# Patient Record
Sex: Female | Born: 1965 | Race: White | Hispanic: No | Marital: Married | Smoking: Current every day smoker
Health system: Southern US, Community
[De-identification: ages and names within clinical notes are randomized; demographics above are authoritative.]

## PROBLEM LIST (undated history)

## (undated) DIAGNOSIS — R011 Cardiac murmur, unspecified: Secondary | ICD-10-CM

## (undated) DIAGNOSIS — R634 Abnormal weight loss: Secondary | ICD-10-CM

## (undated) HISTORY — PX: TUBAL LIGATION: SHX77

## (undated) HISTORY — DX: Abnormal weight loss: R63.4

---

## 2016-05-30 ENCOUNTER — Emergency Department: Payer: Self-pay

## 2016-05-30 ENCOUNTER — Encounter: Payer: Self-pay | Admitting: Urgent Care

## 2016-05-30 ENCOUNTER — Emergency Department
Admission: EM | Admit: 2016-05-30 | Discharge: 2016-05-31 | Disposition: A | Discharge status: Home / Self Care | Payer: Self-pay | Attending: Emergency Medicine | Admitting: Emergency Medicine

## 2016-05-30 DIAGNOSIS — R1012 Left upper quadrant pain: Secondary | ICD-10-CM

## 2016-05-30 DIAGNOSIS — K529 Noninfective gastroenteritis and colitis, unspecified: Secondary | ICD-10-CM | POA: Insufficient documentation

## 2016-05-30 DIAGNOSIS — F17200 Nicotine dependence, unspecified, uncomplicated: Secondary | ICD-10-CM | POA: Insufficient documentation

## 2016-05-30 HISTORY — DX: Cardiac murmur, unspecified: R01.1

## 2016-05-30 LAB — COMPREHENSIVE METABOLIC PANEL
ALBUMIN: 3.8 g/dL (ref 3.5–5.0)
ALT: 13 U/L — ABNORMAL LOW (ref 14–54)
ANION GAP: 7 (ref 5–15)
AST: 16 U/L (ref 15–41)
Alkaline Phosphatase: 53 U/L (ref 38–126)
BILIRUBIN TOTAL: 0.4 mg/dL (ref 0.3–1.2)
BUN: 16 mg/dL (ref 6–20)
CO2: 28 mmol/L (ref 22–32)
Calcium: 9 mg/dL (ref 8.9–10.3)
Chloride: 103 mmol/L (ref 101–111)
Creatinine, Ser: 1.01 mg/dL — ABNORMAL HIGH (ref 0.44–1.00)
GFR calc Af Amer: >60 mL/min (ref >60)
Glucose, Bld: 102 mg/dL — ABNORMAL HIGH (ref 65–99)
POTASSIUM: 3.5 mmol/L (ref 3.5–5.1)
SODIUM: 138 mmol/L (ref 135–145)
TOTAL PROTEIN: 7.6 g/dL (ref 6.5–8.1)

## 2016-05-30 LAB — CBC
HCT: 41.3 % (ref 35.0–47.0)
HEMOGLOBIN: 14.2 g/dL (ref 12.0–16.0)
MCH: 31.2 pg (ref 26.0–34.0)
MCHC: 34.5 g/dL (ref 32.0–36.0)
MCV: 90.5 fL (ref 80.0–100.0)
PLATELETS: 287 10*3/uL (ref 150–440)
RBC: 4.56 MIL/uL (ref 3.80–5.20)
RDW: 12.4 % (ref 11.5–14.5)
WBC: 10.8 10*3/uL (ref 3.6–11.0)

## 2016-05-30 LAB — URINALYSIS COMPLETE WITH MICROSCOPIC (ARMC ONLY)
Bilirubin Urine: NEGATIVE
Glucose, UA: NEGATIVE mg/dL
KETONES UR: NEGATIVE mg/dL
LEUKOCYTES UA: NEGATIVE
Nitrite: NEGATIVE
PH: 6 (ref 5.0–8.0)
PROTEIN: NEGATIVE mg/dL
SPECIFIC GRAVITY, URINE: 1.024 (ref 1.005–1.030)

## 2016-05-30 LAB — POCT PREGNANCY, URINE: Preg Test, Ur: NEGATIVE

## 2016-05-30 MED ORDER — DICYCLOMINE HCL 10 MG PO CAPS: 10.0000 mg | ORAL_CAPSULE | Freq: Three times a day (TID) | ORAL | Status: DC | PRN

## 2016-05-30 MED ORDER — POLYETHYLENE GLYCOL 3350 17 GM/SCOOP PO POWD: 17.0000 g | Freq: Every day | ORAL | Status: DC

## 2016-05-30 MED ORDER — IOPAMIDOL (ISOVUE-300) INJECTION 61%
100.0000 mL | Freq: Once | INTRAVENOUS | Status: AC | PRN
  Administered 2016-05-30: 100 mL via INTRAVENOUS

## 2016-05-30 MED ORDER — DIATRIZOATE MEGLUMINE & SODIUM 66-10 % PO SOLN
15.0000 mL | Freq: Once | ORAL | Status: AC
  Administered 2016-05-30: 15 mL via ORAL

## 2016-05-30 NOTE — ED Notes (Signed)
Patient presents with worsening abdominal pain and diffuse abdominal distention x 2-3 months. Patient has not had a normal BM in over 2 weeks. Patient has been seen by [facility] and advised to try Mag Citrate dose; effective, however pain and distention persistent. Denies nausea, vomiting, and urinary symptoms.

## 2016-05-30 NOTE — ED Notes (Signed)
Pt c/o abnormal bowel movements for months causing abd to be distended - she reports that she went to [facility] walk in and they told her to take mag citrate and it worked but she continues to have "slimy" stools - Pt reports abd discomfort but no difficulty with urination or urinary frequency

## 2016-05-30 NOTE — ED Provider Notes (Addendum)
[facility] Emergency Department Provider Note  Time seen: 11:07 PM  I have reviewed the triage vital signs and the nursing notes.   HISTORY  Chief Complaint Abdominal Pain    HPI Victoria Chan is a 50 y.o. female with no past medical history who presents the emergency department with 2 months of abdominal distention and discomfort. According to the patient for the past 2 months she has felt that her abdomen has been distended air and she states she is constipated but has a bowel movement every day. States the bowel movements are "slimy." Patient states moderate left-sided abdominal pain. She took magnesium citrate with some relief but states after several days the pain returned so she came to the emergency department for evaluation. The patient does not have a primary care doctor. Denies any fever, nausea, vomiting, dysuria. Denies weight loss.Currently states moderate dull left-sided abdominal pain.     Past Medical History  Diagnosis Date  . Murmur     There are no active problems to display for this patient.   Past Surgical History  Procedure Laterality Date  . Tubal ligation      No current outpatient prescriptions on file.  Allergies Review of patient's allergies indicates no known allergies.  No family history on file.  Social History Social History  Substance Use Topics  . Smoking status: Current Every Day Smoker  . Smokeless tobacco: None  . Alcohol Use: Yes    Review of Systems Constitutional: Negative for fever Cardiovascular: Negative for chest pain. Respiratory: Negative for shortness of breath. Gastrointestinal: Moderate left abdominal pain. Negative for nausea, vomiting. Genitourinary: Negative for dysuria. Musculoskeletal: Negative for back pain. Neurological: Negative for headache 10-point ROS otherwise negative.  ____________________________________________   PHYSICAL EXAM:  VITAL SIGNS: ED Triage Vitals  Enc  Vitals Group     BP 05/30/16 2216 146/89 mmHg     Pulse Rate 05/30/16 2216 104     Resp 05/30/16 2216 20     Temp 05/30/16 2216 98.9 F (37.2 C)     Temp Source 05/30/16 2216 Oral     SpO2 05/30/16 2216 100 %     Weight 05/30/16 2216 220 lb (99.791 kg)     Height 05/30/16 2216 5' 3"  (1.6 m)     Head Cir --      Peak Flow --      Pain Score 05/30/16 2216 2     Pain Loc --      Pain Edu? --      Excl. in [place]? --     Constitutional: Alert and oriented. Well appearing and in no distress. Eyes: Normal exam ENT   Head: Normocephalic and atraumatic.   Mouth/Throat: Mucous membranes are moist. Cardiovascular: Normal rate, regular rhythm. No murmur Respiratory: Normal respiratory effort without tachypnea nor retractions. Breath sounds are clear  Gastrointestinal: Soft, moderate left upper quadrant tenderness to palpation. No rebound or guarding. Hyperactive bowel sounds. Dull percussion. Musculoskeletal: Nontender with normal range of motion in all extremities Neurologic:  Normal speech and language. No gross focal neurologic deficits Skin:  Skin is warm, dry and intact.  Psychiatric: Mood and affect are normal  ____________________________________________    RADIOLOGY  CT consistent with colitis.  ____________________________________________    INITIAL IMPRESSION / ASSESSMENT AND PLAN / ED COURSE  Pertinent labs & imaging results that were available during my care of the patient were reviewed by me and considered in my medical decision making (see chart for details).  Patient presents the emergency department with abdominal distention and vague left-sided discomfort for the past 2 months. States she has been having abnormal but daily bowel movements. States mild left-sided abdominal pain. Patient is labs are within normal limits. Given her left-sided abdominal tenderness with complaints of abdominal distention we'll obtain a CT abdomen/pelvis to further evaluate. I  discussed with the patient the CT the abdomen is normal that she will need to follow up with GI medicine for further testing. Patient is agreeable to this plan.  CT consistent with colitis. Infectious versus inflammatory. Overall the patient appears very well. We'll cover with antibiotics now the patient follow-up with GI medicine. The patient is agreeable to this plan.  ____________________________________________   FINAL CLINICAL IMPRESSION(S) / ED DIAGNOSES  Abdominal pain Colitis  Harvest Dark, MD 05/30/16 2312  Harvest Dark, MD 05/30/16 2359

## 2016-05-30 NOTE — ED Notes (Signed)
CT called and notified that pt had finished drinking contrast

## 2016-05-31 MED ORDER — METRONIDAZOLE 500 MG PO TABS
500.0000 mg | ORAL_TABLET | Freq: Once | ORAL | Status: AC
  Administered 2016-05-31: 500 mg via ORAL
  Filled 2016-05-31: qty 1

## 2016-05-31 MED ORDER — METRONIDAZOLE 500 MG PO TABS: 500.0000 mg | ORAL_TABLET | Freq: Three times a day (TID) | ORAL | Status: AC

## 2016-05-31 MED ORDER — TRAMADOL HCL 50 MG PO TABS: 50.0000 mg | ORAL_TABLET | Freq: Four times a day (QID) | ORAL | Status: AC | PRN

## 2016-05-31 MED ORDER — CIPROFLOXACIN HCL 500 MG PO TABS: 500.0000 mg | ORAL_TABLET | Freq: Two times a day (BID) | ORAL | Status: AC

## 2016-05-31 MED ORDER — CIPROFLOXACIN HCL 500 MG PO TABS
500.0000 mg | ORAL_TABLET | Freq: Once | ORAL | Status: AC
  Administered 2016-05-31: 500 mg via ORAL
  Filled 2016-05-31: qty 1

## 2016-05-31 NOTE — Discharge Instructions (Signed)
Abdominal Pain, Adult Many things can cause abdominal pain. Usually, abdominal pain is not caused by a disease and will improve without treatment. It can often be observed and treated at home. Your health care provider will do a physical exam and possibly order blood tests and X-rays to help determine the seriousness of your pain. However, in many cases, more time must pass before a clear cause of the pain can be found. Before that point, your health care provider may not know if you need more testing or further treatment. HOME CARE INSTRUCTIONS Monitor your abdominal pain for any changes. The following actions may help to alleviate any discomfort you are experiencing:  Only take over-the-counter or prescription medicines as directed by your health care provider.  Do not take laxatives unless directed to do so by your health care provider.  Try a clear liquid diet (broth, tea, or water) as directed by your health care provider. Slowly move to a bland diet as tolerated. SEEK MEDICAL CARE IF:  You have unexplained abdominal pain.  You have abdominal pain associated with nausea or diarrhea.  You have pain when you urinate or have a bowel movement.  You experience abdominal pain that wakes you in the night.  You have abdominal pain that is worsened or improved by eating food.  You have abdominal pain that is worsened with eating fatty foods.  You have a fever. SEEK IMMEDIATE MEDICAL CARE IF:  Your pain does not go away within 2 hours.  You keep throwing up (vomiting).  Your pain is felt only in portions of the abdomen, such as the right side or the left lower portion of the abdomen.  You pass bloody or black tarry stools. MAKE SURE YOU:  Understand these instructions.  Will watch your condition.  Will get help right away if you are not doing well or get worse.   This information is not intended to replace advice given to you by your health care provider. Make sure you discuss  any questions you have with your health care provider.   Document Released: 08/20/2005 Document Revised: 08/01/2015 Document Reviewed: 07/20/2013 Elsevier Interactive Patient Education 2016 Elsevier Inc[employer]  Colitis Colitis is inflammation of the colon. Colitis may last a short time (acute) or it may last a long time (chronic). CAUSES This condition may be caused by:  Viruses.  Bacteria.  Reactions to medicine.  Certain autoimmune diseases, such as Crohn disease or ulcerative colitis. SYMPTOMS Symptoms of this condition include:  Diarrhea.  Passing bloody or tarry stool.  Pain.  Fever.  Vomiting.  Tiredness (fatigue).  Weight loss.  Bloating.  Sudden increase in abdominal pain.  Having fewer bowel movements than usual. DIAGNOSIS This condition is diagnosed with a stool test or a blood test. You may also have other tests, including X-rays, a CT scan, or a colonoscopy. TREATMENT Treatment may include:  Resting the bowel. This involves not eating or drinking for a period of time.  Fluids that are given through an IV tube.  Medicine for pain and diarrhea.  Antibiotic medicines.  Cortisone medicines.  Surgery. HOME CARE INSTRUCTIONS Eating and Drinking  Follow instructions from your health care provider about eating or drinking restrictions.  Drink enough fluid to keep your urine clear or pale yellow.  Work with a dietitian to determine which foods cause your condition to flare up.  Avoid foods that cause flare-ups.  Eat a well-balanced diet. Medicines  Take over-the-counter and prescription medicines only as told by your health  care provider.  If you were prescribed an antibiotic medicine, take it as told by your health care provider. Do not stop taking the antibiotic even if you start to feel better. General Instructions  Keep all follow-up visits as told by your health care provider. This is important. SEEK MEDICAL CARE IF:  Your symptoms do  not go away.  You develop new symptoms. SEEK IMMEDIATE MEDICAL CARE IF:  You have a fever that does not go away with treatment.  You develop chills.  You have extreme weakness, fainting, or dehydration.  You have repeated vomiting.  You develop severe pain in your abdomen.  You pass bloody or tarry stool.   This information is not intended to replace advice given to you by your health care provider. Make sure you discuss any questions you have with your health care provider.   Document Released: 12/18/2004 Document Revised: 08/01/2015 Document Reviewed: 03/05/2015 Elsevier Interactive Patient Education [employer].

## 2016-06-09 ENCOUNTER — Encounter: Payer: Self-pay | Admitting: Gastroenterology

## 2016-06-09 ENCOUNTER — Other Ambulatory Visit: Payer: Self-pay

## 2016-06-09 ENCOUNTER — Ambulatory Visit (INDEPENDENT_AMBULATORY_CARE_PROVIDER_SITE_OTHER): Payer: Self-pay | Admitting: Gastroenterology

## 2016-06-09 VITALS — BP 153/84 | HR 87 | Temp 99.0°F | Ht 63.0 in | Wt 224.0 lb

## 2016-06-09 DIAGNOSIS — R935 Abnormal findings on diagnostic imaging of other abdominal regions, including retroperitoneum: Secondary | ICD-10-CM

## 2016-06-09 NOTE — Progress Notes (Signed)
Gastroenterology Consultation  Referring Provider:     No ref. provider found Primary Care Physician:  No PCP Per Patient Primary Gastroenterologist:  Dr. Allen Norris     Reason for Consultation:     Abdominal pain        HPI:   Victoria Chan is a 50 y.o. y/o female referred for consultation & management of Abdominal pain by Dr. Rayne Du PCP Per Patient.  This patient comes to me after being seen in the emergency  department for 2 months of abdominal pain with distention.  The patient reports that she has had GI problems most of her life.  The patient also reports that she had slimy-appearing stools with left sided abdominal pain.  The patient had a CT scan in the ER. The report stated that she had colitis from the splenic flexure through the sigmoid colon.  The patient denies any family history of colon cancer or colon polyps.   She also denies ever having a colonoscopy in the past.  There is no report of any unexplained weight loss.  Past Medical History  Diagnosis Date  . Murmur     Past Surgical History  Procedure Laterality Date  . Tubal ligation      Prior to Admission medications   Medication Sig Start Date End Date Taking? Authorizing Provider  ciprofloxacin (CIPRO) 500 MG tablet Take 1 tablet (500 mg total) by mouth 2 (two) times daily. 05/31/16 06/10/16 Yes Harvest Dark, MD  metroNIDAZOLE (FLAGYL) 500 MG tablet Take 500 mg by mouth 3 (three) times daily.   Yes Historical Provider, MD  polyethylene glycol (MIRALAX / GLYCOLAX) packet Take by mouth. Reported on 06/09/2016    Historical Provider, MD  traMADol (ULTRAM) 50 MG tablet Take 1 tablet (50 mg total) by mouth every 6 (six) hours as needed. Patient not taking: Reported on 06/09/2016 05/31/16 05/31/17  Harvest Dark, MD    Family History  Problem Relation Age of Onset  . Heart disease Father      Social History  Substance Use Topics  . Smoking status: Current Every Day Smoker  . Smokeless tobacco: Never Used  . Alcohol  Use: Yes    Allergies as of 06/09/2016  . (No Known Allergies)    Review of Systems:    All systems reviewed and negative except where noted in HPI.   Physical Exam:  BP 153/84 mmHg  Pulse 87  Temp(Src) 99 F (37.2 C) (Oral)  Ht 5' 3"  (1.6 m)  Wt 224 lb (101.606 kg)  BMI 39.69 kg/m2  LMP 04/24/2016 Patient's last menstrual period was 04/24/2016. Psych:  Alert and cooperative. Normal mood and affect. General:   Alert,  Well-developed, well-nourished, pleasant and cooperative in NAD Head:  Normocephalic and atraumatic. Eyes:  Sclera clear, no icterus.   Conjunctiva pink. Ears:  Normal auditory acuity. Nose:  No deformity, discharge, or lesions. Mouth:  No deformity or lesions,oropharynx pink & moist. Neck:  Supple; no masses or thyromegaly. Lungs:  Respirations even and unlabored.  Clear throughout to auscultation.   No wheezes, crackles, or rhonchi. No acute distress. Heart:  Regular rate and rhythm; no murmurs, clicks, rubs, or gallops. Abdomen:  Normal bowel sounds.  No bruits.  Soft, non-tender and non-distended without masses, hepatosplenomegaly or hernias noted.  No guarding or rebound tenderness.  Negative Carnett sign.   Rectal:  Deferred.  Msk:  Symmetrical without gross deformities.  Good, equal movement & strength bilaterally. Pulses:  Normal pulses noted. Extremities:  No  clubbing or edema.  No cyanosis. Neurologic:  Alert and oriented x3;  grossly normal neurologically. Skin:  Intact without significant lesions or rashes.  No jaundice. Lymph Nodes:  No significant cervical adenopathy. Psych:  Alert and cooperative. Normal mood and affect.  Imaging Studies: Ct Abdomen Pelvis W Contrast  05/30/2016  CLINICAL DATA:  Left upper quadrant pain. Abdominal distension. Abnormal bowel movements for months. EXAM: CT ABDOMEN AND PELVIS WITH CONTRAST TECHNIQUE: Multidetector CT imaging of the abdomen and pelvis was performed using the standard protocol following bolus  administration of intravenous contrast. CONTRAST:  150m ISOVUE-300 IOPAMIDOL (ISOVUE-300) INJECTION 61% COMPARISON:  None. FINDINGS: Lower chest:  The included lung bases are clear. Liver: No focal lesion. Hepatobiliary: Gallbladder partially distended, no calcified stone. No biliary dilatation. Pancreas: No ductal dilatation or inflammation. Spleen: Normal. Adrenal glands: No nodule. Kidneys: Symmetric renal enhancement.  No hydronephrosis. Stomach/Bowel: Stomach is distended with ingested contrast and contents. There are no dilated or thickened small bowel loops. There is colonic wall thickening and pericolonic edema from the splenic flexure through the mid sigmoid colon. Small adjacent mesenteric nodes. Moderate stool in the proximal colon. Mild flaring of the mid appendix assuring 10 mm CT however no periappendiceal inflammation, and this is likely spurious. Vascular/Lymphatic: No retroperitoneal adenopathy. Abdominal aorta is normal in caliber. Reproductive: Uterus is retroverted. Ovaries symmetric in size. No adnexal mass. Bladder: Minimally distended. Other: No free air, free fluid, or intra-abdominal fluid collection. Musculoskeletal: There are no acute or suspicious osseous abnormalities. Degenerative change in the spine and both sacroiliac joints. IMPRESSION: Colitis from the splenic flexure through the mid sigmoid colon, may be infectious or inflammatory. Electronically Signed   By: MJeb LeveringM.D.   On: 05/30/2016 23:45    Assessment and Plan:   Victoria Chan is a 50y.o. y/o female with colitis seen on a CT scan.  The patient was started on antibiotics in the ER and reports that she is feeling much better.The patient will be set up for a colonoscopy. I have discussed risks & benefits which include, but are not limited to, bleeding, infection, perforation & drug reaction.  The patient agrees with this plan & written consent will be obtained.      Note: This dictation was prepared with  Dragon dictation along with smaller phrase technology. Any transcriptional errors that result from this process are unintentional.

## 2016-12-31 ENCOUNTER — Telehealth: Payer: Self-pay | Admitting: Nurse Practitioner

## 2016-12-31 DIAGNOSIS — R059 Cough, unspecified: Secondary | ICD-10-CM

## 2016-12-31 DIAGNOSIS — R05 Cough: Secondary | ICD-10-CM

## 2016-12-31 NOTE — Progress Notes (Signed)
We are sorry that you are not feeling well.  Here is how we plan to help!  Based on what you have shared with me it looks like you have upper respiratory tract inflammation that has resulted in a significant cough.  Inflammation and infection in the upper respiratory tract is commonly called bronchitis and has four common causes:  Allergies, Viral Infections, Acid Reflux and Bacterial Infections.  Allergies, viruses and acid reflux are treated by controlling symptoms or eliminating the cause. An example might be a cough caused by taking certain blood pressure medications. You stop the cough by changing the medication. Another example might be a cough caused by acid reflux. Controlling the reflux helps control the cough.  Based on your presentation I believe you most likely have A cough due to a virus.  This is called viral bronchitis and is best treated by rest, plenty of fluids and control of the cough.  You will need to contact your OB Doctor to see what you can take OTC.       HOME CARE . Only take medications as instructed by your medical team. . Complete the entire course of an antibiotic. . Drink plenty of fluids and get plenty of rest. . Avoid close contacts especially the very young and the elderly . Cover your mouth if you cough or cough into your sleeve. . Always remember to wash your hands . A steam or ultrasonic humidifier can help congestion.   GET HELP RIGHT AWAY IF: . You develop worsening fever. . You become short of breath . You cough up blood. . Your symptoms persist after you have completed your treatment plan MAKE SURE YOU   Understand these instructions.  Will watch your condition.  Will get help right away if you are not doing well or get worse.  Your e-visit answers were reviewed by a board certified advanced clinical practitioner to complete your personal care plan.  Depending on the condition, your plan could have included both over the counter or prescription  medications. If there is a problem please reply  once you have received a response from your provider. Your safety is important to [place].  If you have drug allergies check your prescription carefully.    You can use MyChart to ask questions about today's visit, request a non-urgent call back, or ask for a work or school excuse for 24 hours related to this e-Visit. If it has been greater than 24 hours you will need to follow up with your provider, or enter a new e-Visit to address those concerns. You will get an e-mail in the next two days asking about your experience.  I hope that your e-visit has been valuable and will speed your recovery. Thank you for using e-visits.

## 2017-06-17 IMAGING — CT CT ABD-PELV W/ CM
2 of 5 series · 16 of 46 positions shown, 18 images · IV contrast (iopamidol)
Comparison: None.

CLINICAL DATA: Left upper quadrant pain. Abdominal distension.
Abnormal bowel movements for months.

EXAM:
CT ABDOMEN AND PELVIS WITH CONTRAST
TECHNIQUE: Multidetector CT imaging of the abdomen and pelvis was performed
using the standard protocol following bolus administration of
intravenous contrast.
CONTRAST:  100mL SQ3UE1-C33 IOPAMIDOL (SQ3UE1-C33) INJECTION 61%

[Series 2: routine abd pel with · axial · 0.90mm/px · z∈[-512,-92]mm · 13 of 94 slices shown, 15 images]
[im 5/94  soft-tissue]
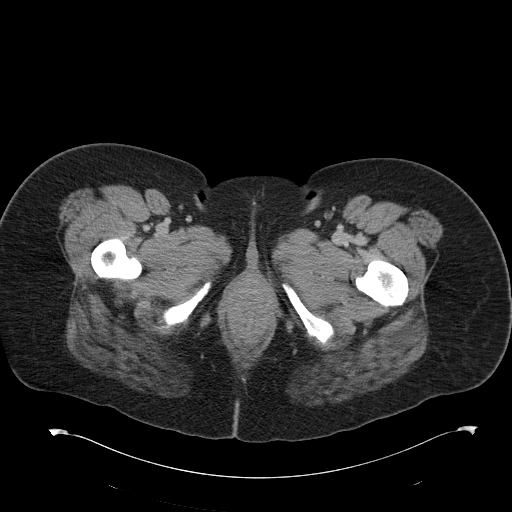
[im 5/94  bone]
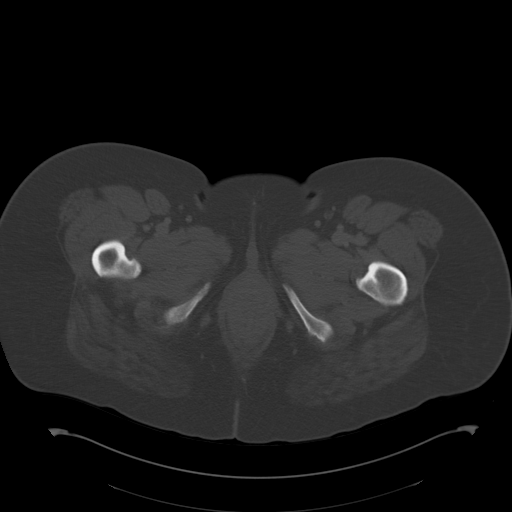
[im 14/94  soft-tissue]
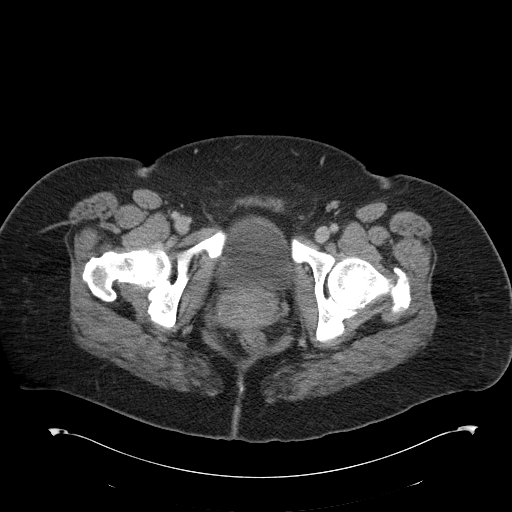
[im 19/94  soft-tissue]
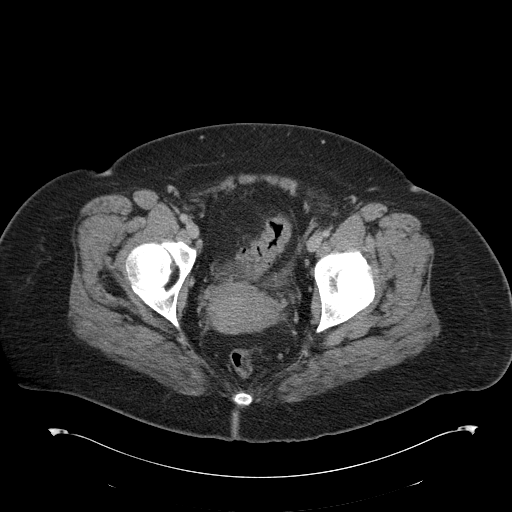
[im 28/94  soft-tissue]
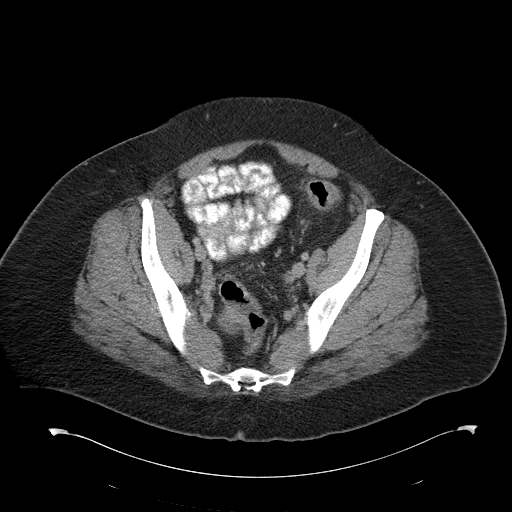
[im 33/94  soft-tissue]
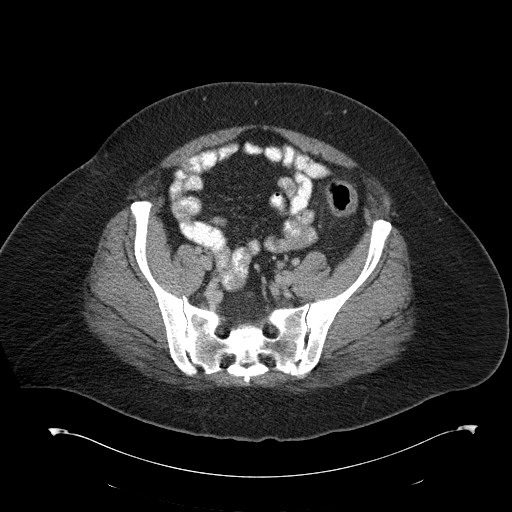
[im 42/94  soft-tissue]
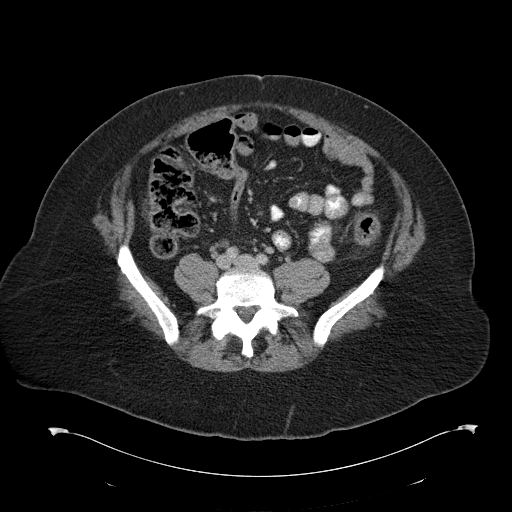
[im 47/94  soft-tissue]
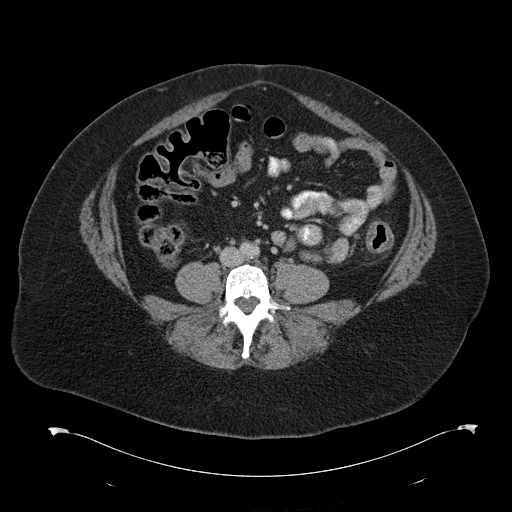
[im 52/94  soft-tissue]
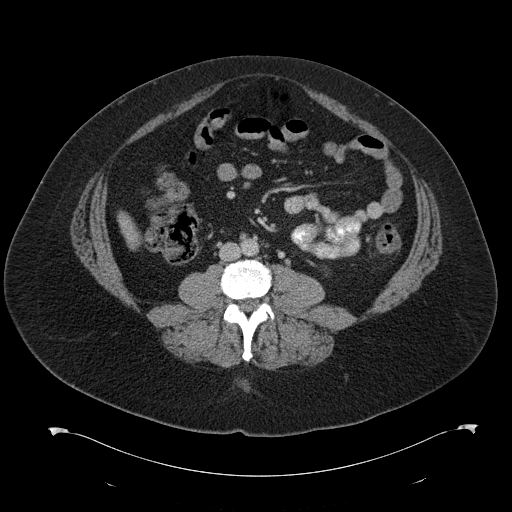
[im 61/94  soft-tissue]
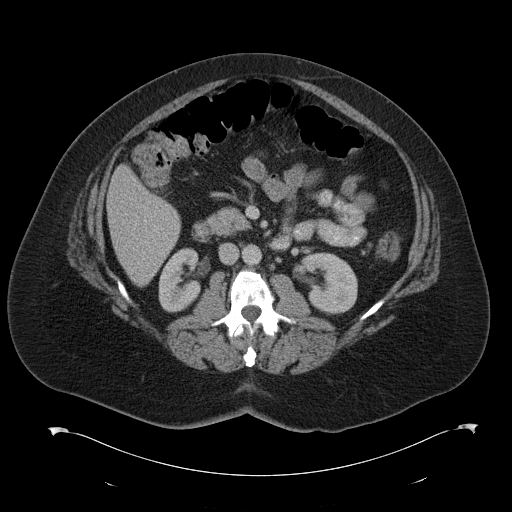
[im 61/94  bone]
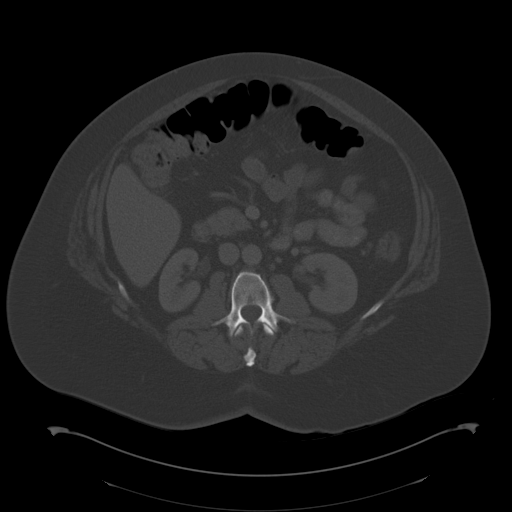
[im 66/94  soft-tissue]
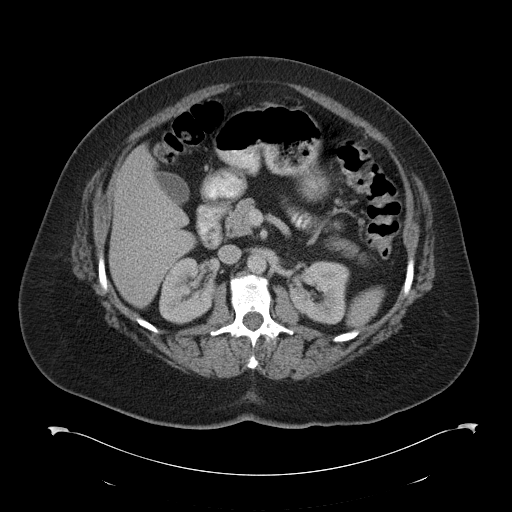
[im 75/94  soft-tissue]
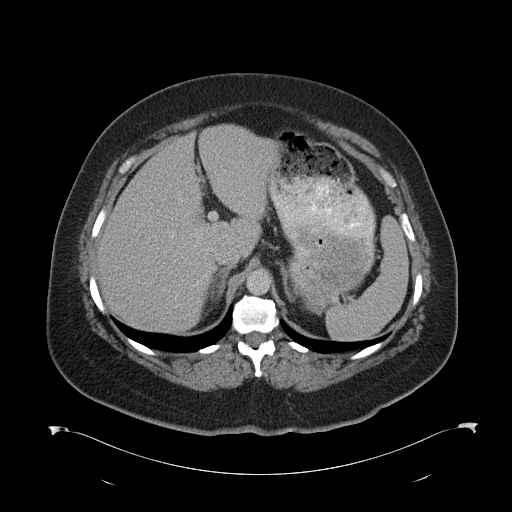
[im 80/94  soft-tissue]
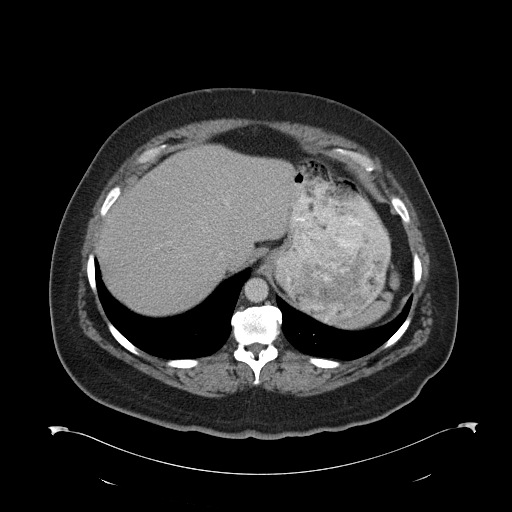
[im 89/94  soft-tissue]
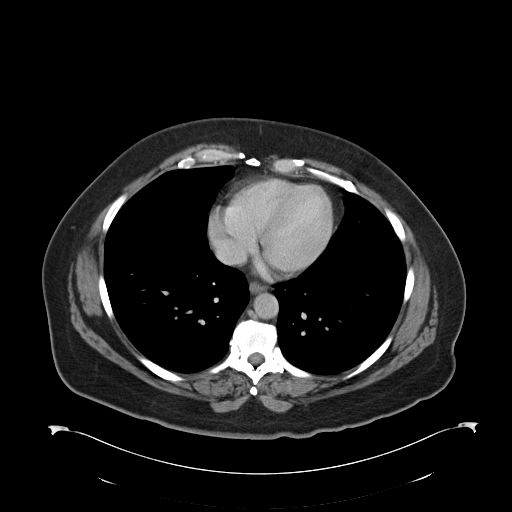

[Series 5: cor routine abd pel with · coronal · 0.78mm/px · 3 of 169 slices shown]
[im 57/169  soft-tissue]
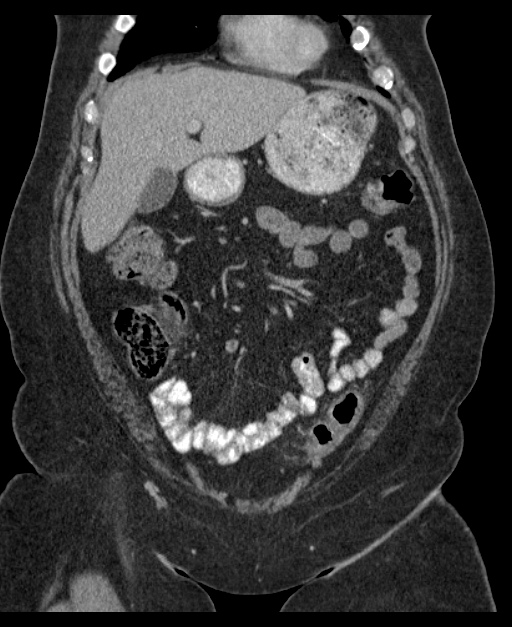
[im 75/169  soft-tissue]
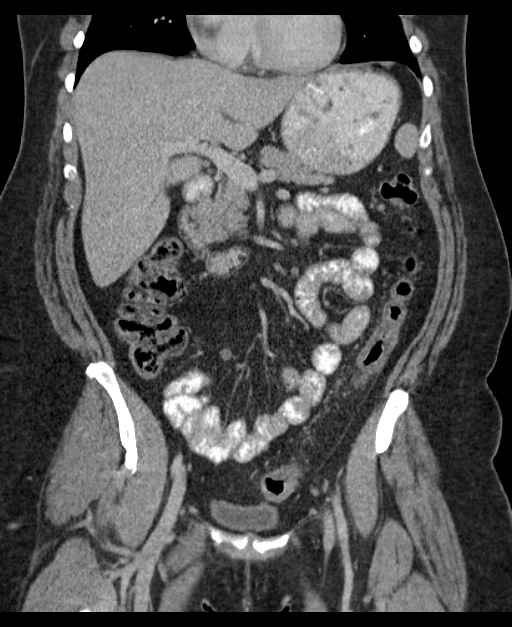
[im 94/169  soft-tissue]
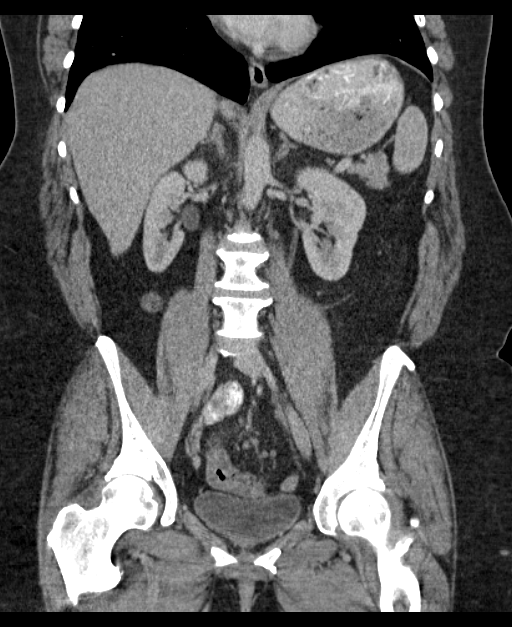

[16 of 46 positions shown; findings below may reference images not displayed]

FINDINGS: Lower chest:  The included lung bases are clear.

Liver: No focal lesion.

Hepatobiliary: Gallbladder partially distended, no calcified stone.
No biliary dilatation.

Pancreas: No ductal dilatation or inflammation.

Spleen: Normal.

Adrenal glands: No nodule.

Kidneys: Symmetric renal enhancement.  No hydronephrosis.

Stomach/Bowel: Stomach is distended with ingested contrast and
contents. There are no dilated or thickened small bowel loops. There
is colonic wall thickening and pericolonic edema from the splenic
flexure through the mid sigmoid colon. Small adjacent mesenteric
nodes. Moderate stool in the proximal colon. Mild flaring of the mid
appendix assuring 10 mm CT however no periappendiceal inflammation,
and this is likely spurious.

Vascular/Lymphatic: No retroperitoneal adenopathy. Abdominal aorta
is normal in caliber.

Reproductive: Uterus is retroverted. Ovaries symmetric in size. No
adnexal mass.

Bladder: Minimally distended.

Other: No free air, free fluid, or intra-abdominal fluid collection.

Musculoskeletal: There are no acute or suspicious osseous
abnormalities. Degenerative change in the spine and both sacroiliac
joints.
IMPRESSION: Colitis from the splenic flexure through the mid sigmoid colon, may
be infectious or inflammatory.

## 2019-09-15 ENCOUNTER — Other Ambulatory Visit: Payer: Self-pay

## 2019-09-15 ENCOUNTER — Encounter: Payer: Self-pay | Admitting: Gastroenterology

## 2019-09-15 ENCOUNTER — Ambulatory Visit: Payer: Self-pay | Admitting: Gastroenterology

## 2019-09-15 VITALS — BP 123/75 | HR 67 | Temp 98.1°F | Ht 63.0 in | Wt 205.2 lb

## 2019-09-15 DIAGNOSIS — R1012 Left upper quadrant pain: Secondary | ICD-10-CM

## 2019-09-15 DIAGNOSIS — R935 Abnormal findings on diagnostic imaging of other abdominal regions, including retroperitoneum: Secondary | ICD-10-CM

## 2019-09-15 NOTE — Progress Notes (Signed)
Victoria Chan, [place] [address]  Main: [phone]  Fax: [phone]   Gastroenterology Consultation  Referring Provider:     Charlette Caffey Primary Care Physician:  Patient, No Pcp Per Reason for Consultation:     Abdominal pain        HPI:    Chief Complaint  Patient presents with  . New Patient (Initial Visit)  . Abdominal Pain    Victoria Chan is a 53 y.o. y/o female referred for consultation & management  by Dr. Patient, No Pcp Per.  Patient reports 1 week history of abdominal pain, left upper quadrant, dull, nonradiating, 5/10.  Visited urgent care 2 days ago and was given antibiotics and with this pain has improved.  No blood in stool.  No altered bowel habits.  No nausea or vomiting.  Had a similar episode in 2017 at which time she had a CT scan that showed thickening at the splenic flexure to sigmoid colon.  She said she saw Dr. Allen Norris at the time a colonoscopy was recommended but due to insurance issues she did not get it done.  No prior colonoscopies.  Past Medical History:  Diagnosis Date  . Murmur     Past Surgical History:  Procedure Laterality Date  . TUBAL LIGATION      Prior to Admission medications   Medication Sig Start Date End Date Taking? Authorizing Provider  metroNIDAZOLE (FLAGYL) 500 MG tablet Take 500 mg by mouth 3 (three) times daily.   Yes [provider]  dicyclomine (BENTYL) 10 MG capsule Take 1 capsule (10 mg total) by mouth 3 (three) times daily as needed for spasms (abdominal discomfort). 05/30/16 05/31/16  Harvest Dark, MD    Family History  Problem Relation Age of Onset  . Heart disease Father      Social History   Tobacco Use  . Smoking status: Current Every Day Smoker  . Smokeless tobacco: Never Used  Substance Use Topics  . Alcohol use: Yes  . Drug use: No    Allergies as of 09/15/2019  . (No Known Allergies)    Review of Systems:    All systems reviewed  and negative except where noted in HPI.   Physical Exam:  BP 123/75 (BP Location: Left Arm, Patient Position: Sitting, Cuff Size: Normal)   Pulse 67   Temp 98.1 F (36.7 C) (Oral)   Ht 5' 3"  (1.6 m)   Wt 205 lb 4 oz (93.1 kg)   BMI 36.36 kg/m  No LMP recorded. Psych:  Alert and cooperative. Normal mood and affect. General:   Alert,  Well-developed, well-nourished, pleasant and cooperative in NAD Head:  Normocephalic and atraumatic. Eyes:  Sclera clear, no icterus.   Conjunctiva pink. Ears:  Normal auditory acuity. Nose:  No deformity, discharge, or lesions. Mouth:  No deformity or lesions,oropharynx pink & moist. Neck:  Supple; no masses or thyromegaly. Abdomen:  Normal bowel sounds.  No bruits.  Soft, non-tender and non-distended without masses, hepatosplenomegaly or hernias noted.  No guarding or rebound tenderness.    Msk:  Symmetrical without gross deformities. Good, equal movement & strength bilaterally. Pulses:  Normal pulses noted. Extremities:  No clubbing or edema.  No cyanosis. Neurologic:  Alert and oriented x3;  grossly normal neurologically. Skin:  Intact without significant lesions or rashes. No jaundice. Lymph Nodes:  No significant cervical adenopathy. Psych:  Alert and cooperative. Normal mood and affect.   Labs: CBC  Component Value Date/Time   WBC 10.8 05/30/2016 2231   RBC 4.56 05/30/2016 2231   HGB 14.2 05/30/2016 2231   HCT 41.3 05/30/2016 2231   PLT 287 05/30/2016 2231   MCV 90.5 05/30/2016 2231   MCH 31.2 05/30/2016 2231   MCHC 34.5 05/30/2016 2231   RDW 12.4 05/30/2016 2231   CMP     Component Value Date/Time   NA 138 05/30/2016 2231   K 3.5 05/30/2016 2231   CL 103 05/30/2016 2231   CO2 28 05/30/2016 2231   GLUCOSE 102 (H) 05/30/2016 2231   BUN 16 05/30/2016 2231   CREATININE 1.01 (H) 05/30/2016 2231   CALCIUM 9.0 05/30/2016 2231   PROT 7.6 05/30/2016 2231   ALBUMIN 3.8 05/30/2016 2231   AST 16 05/30/2016 2231   ALT 13 (L)  05/30/2016 2231   ALKPHOS 53 05/30/2016 2231   BILITOT 0.4 05/30/2016 2231   GFRNONAA >60 05/30/2016 2231   GFRAA >60 05/30/2016 2231    Imaging Studies: CT report 2017 reviewed  Assessment and Plan:   Victoria Chan is a 53 y.o. y/o female has been referred for abdominal pain  Patient has never had a colonoscopy New abdominal pain needs further evaluation with CT scan, especially since previous CT scan 3 years ago was abnormal  if active inflammation present, will wait to do colonoscopy for at least 6 to 8 weeks  We will also obtain labs today as patient has not had any lab work done in 3 years.  2017 labs reviewed and was otherwise reassuring  Patient urged to follow-up with her primary care provider as well and she verbalized understanding   Dr Victoria Antigua  Speech recognition software was used to dictate the above note.

## 2019-09-15 NOTE — Patient Instructions (Addendum)
Your CT abdominal and pelvis is scheduled on 09/23/2019 at 10:30 arrive at 10:15am. Nothing to eat or drank 4 hours before. Please pick up prep before the procedure. Please go to outpatient imaging [address], [place], [place] [address]

## 2019-09-16 LAB — CBC
Hematocrit: 41.2 % (ref 34.0–46.6)
Hemoglobin: 13.8 g/dL (ref 11.1–15.9)
MCH: 29.4 pg (ref 26.6–33.0)
MCHC: 33.5 g/dL (ref 31.5–35.7)
MCV: 88 fL (ref 79–97)
Platelets: 302 10*3/uL (ref 150–450)
RBC: 4.69 x10E6/uL (ref 3.77–5.28)
RDW: 12.8 % (ref 11.7–15.4)
WBC: 10.5 10*3/uL (ref 3.4–10.8)

## 2019-09-16 LAB — COMPREHENSIVE METABOLIC PANEL
ALT: 10 IU/L (ref 0–32)
AST: 10 IU/L (ref 0–40)
Albumin/Globulin Ratio: 1.7 (ref 1.2–2.2)
Albumin: 4.3 g/dL (ref 3.8–4.9)
Alkaline Phosphatase: 61 IU/L (ref 39–117)
BUN/Creatinine Ratio: 10 (ref 9–23)
BUN: 11 mg/dL (ref 6–24)
Bilirubin Total: <0.2 mg/dL (ref 0.0–1.2)
CO2: 25 mmol/L (ref 20–29)
Calcium: 9.7 mg/dL (ref 8.7–10.2)
Chloride: 102 mmol/L (ref 96–106)
Creatinine, Ser: 1.05 mg/dL — ABNORMAL HIGH (ref 0.57–1.00)
GFR calc Af Amer: 71 mL/min/{1.73_m2} (ref >59)
GFR calc non Af Amer: 62 mL/min/{1.73_m2} (ref >59)
Globulin, Total: 2.6 g/dL (ref 1.5–4.5)
Glucose: 105 mg/dL — ABNORMAL HIGH (ref 65–99)
Potassium: 5.1 mmol/L (ref 3.5–5.2)
Sodium: 140 mmol/L (ref 134–144)
Total Protein: 6.9 g/dL (ref 6.0–8.5)

## 2019-09-16 LAB — C-REACTIVE PROTEIN: CRP: 5 mg/L (ref 0–10)

## 2019-09-20 ENCOUNTER — Telehealth: Payer: Self-pay

## 2019-09-20 NOTE — Telephone Encounter (Signed)
Patient verbalized understanding. Patient states she does not have a PCP at this time. Patient was given a list of PCP offices at her office visit that was accepting new patients. Patient states she will make a appointment with them very soon. Called the Radiology department at [phone] and Darrick Meigs the radiologist states that we are fine to do the CT abdominal and pelvis with contrast with her Creatinine level.

## 2019-09-20 NOTE — Telephone Encounter (Signed)
-----   Message from Virgel Manifold, MD sent at 09/20/2019 10:46 AM EDT ----- Caryl Pina please let the patient know, her labs show slight elevation in creatnine. This is her kidney function and needs to be followed and adressed by her PCP. Her glucose was also slightly elevated. Please ask her who her PCP is and update it, or ask her to see a PCP soon in this regard. Please ask radiology if her CT with contrast is still ok with that creatnine. Please forward her PCP these results  She should hydrate well and drink 8-10 glasses of water a day, especially before her CT scan

## 2019-09-23 ENCOUNTER — Ambulatory Visit: Admission: RE | Admit: 2019-09-23 | Payer: Self-pay | Source: Ambulatory Visit

## 2020-02-14 ENCOUNTER — Other Ambulatory Visit: Payer: Self-pay

## 2020-02-14 ENCOUNTER — Ambulatory Visit: Payer: Self-pay | Admitting: Gastroenterology

## 2020-02-14 ENCOUNTER — Encounter: Payer: Self-pay | Admitting: Gastroenterology

## 2020-02-14 VITALS — BP 144/76 | HR 87 | Temp 98.1°F | Ht 63.0 in | Wt 200.0 lb

## 2020-02-14 DIAGNOSIS — R194 Change in bowel habit: Secondary | ICD-10-CM

## 2020-02-14 DIAGNOSIS — R1032 Left lower quadrant pain: Secondary | ICD-10-CM

## 2020-02-14 NOTE — Progress Notes (Signed)
Primary Care Physician: Ranae Plumber, [place]  Primary Gastroenterologist:  Dr. Lucilla Lame  Chief Complaint  Patient presents with  . Abdominal Pain    HPI: Victoria Chan is a 54 y.o. female here for follow-up of left-sided abdominal pain.  The patient had a CT scan in the past that showed colitis from the splenic flexure down to the sigmoid colon.  The patient was treated with antibiotics and followed up again with Dr. Bonna Gains back in October of last year and at that time the patient was recommended to undergo a colonoscopy.  The patient says she has abdominal pain in the left lower quadrant and left middle quadrant that usually last anywhere from 1 to 3 days.  She also reports that she has lost about 27 pounds in the last 7 months.  This has been unintentional weight loss.  The patient has been hesitant to do anything in the past because of lack of insurance.  There is also a report of a change in her consistency of stool and she states that it is much softer and flaky which is a change for her.  Past Medical History:  Diagnosis Date  . Murmur     No current outpatient medications on file.   No current facility-administered medications for this visit.    Allergies as of 02/14/2020  . (No Known Allergies)    ROS:  General: Negative for anorexia, weight loss, fever, chills, fatigue, weakness. ENT: Negative for hoarseness, difficulty swallowing , nasal congestion. CV: Negative for chest pain, angina, palpitations, dyspnea on exertion, peripheral edema.  Respiratory: Negative for dyspnea at rest, dyspnea on exertion, cough, sputum, wheezing.  GI: See history of present illness. GU:  Negative for dysuria, hematuria, urinary incontinence, urinary frequency, nocturnal urination.  Endo: Negative for unusual weight change.    Physical Examination:   BP (!) 144/76   Pulse 87   Temp 98.1 F (36.7 C) (Oral)   Ht 5' 3"  (1.6 m)   Wt 200 lb (90.7 kg)   BMI 35.43 kg/m    General: Well-nourished, well-developed in no acute distress.  Eyes: No icterus. Conjunctivae pink. Lungs: Clear to auscultation bilaterally. Non-labored. Heart: Regular rate and rhythm, no murmurs rubs or gallops.  Abdomen: Bowel sounds are normal, positive tenderness to deep palpation on the left lower quadrant, nondistended, no hepatosplenomegaly or masses, no abdominal bruits or hernia , no rebound or guarding.   Extremities: No lower extremity edema. No clubbing or deformities. Neuro: Alert and oriented x 3.  Grossly intact. Skin: Warm and dry, no jaundice.   Psych: Alert and cooperative, normal mood and affect.  Labs:    Imaging Studies: No results found.  Assessment and Plan:   Victoria Chan is a 54 y.o. y/o female who comes in today with a history of colitis in the splenic flexure through the mid sigmoid colon back in 2017.  The patient has been hesitant to do any procedures due to the lack of insurance.  The patient has been told that since she has had a 27 pound weight loss in the last 7 months and she has also had a change in her bowel habits and consistency with the abnormal CT scan it would be wise for her to undergo a colonoscopy.  The patient is now in agreement to undergo the colonoscopy.  The patient will be set up for this procedure.  She has been explained the plan and agrees with it.     Lucilla Lame,  MD. Marval Regal    Note: This dictation was prepared with Dragon dictation along with smaller phrase technology. Any transcriptional errors that result from this process are unintentional.

## 2020-03-16 ENCOUNTER — Other Ambulatory Visit
Admission: RE | Admit: 2020-03-16 | Discharge: 2020-03-16 | Disposition: A | Discharge status: Home / Self Care | Payer: HRSA Program | Source: Ambulatory Visit | Attending: Gastroenterology | Admitting: Gastroenterology

## 2020-03-16 DIAGNOSIS — Z01812 Encounter for preprocedural laboratory examination: Secondary | ICD-10-CM | POA: Insufficient documentation

## 2020-03-16 DIAGNOSIS — Z20822 Contact with and (suspected) exposure to covid-19: Secondary | ICD-10-CM | POA: Diagnosis not present

## 2020-03-16 LAB — SARS CORONAVIRUS 2 (TAT 6-24 HRS): SARS Coronavirus 2: NEGATIVE

## 2020-03-19 ENCOUNTER — Encounter: Payer: Self-pay | Admitting: Gastroenterology

## 2020-03-20 ENCOUNTER — Encounter: Payer: Self-pay | Admitting: Gastroenterology

## 2020-03-20 ENCOUNTER — Ambulatory Visit: Payer: Self-pay | Admitting: Anesthesiology

## 2020-03-20 ENCOUNTER — Encounter
Admission: RE | Disposition: A | Discharge status: Home / Self Care | Payer: Self-pay | Source: Home / Self Care | Attending: Gastroenterology

## 2020-03-20 ENCOUNTER — Other Ambulatory Visit: Payer: Self-pay

## 2020-03-20 ENCOUNTER — Ambulatory Visit
Admission: RE | Admit: 2020-03-20 | Discharge: 2020-03-20 | Disposition: A | Discharge status: Home / Self Care | Payer: Self-pay | Attending: Gastroenterology | Admitting: Gastroenterology

## 2020-03-20 DIAGNOSIS — E669 Obesity, unspecified: Secondary | ICD-10-CM | POA: Insufficient documentation

## 2020-03-20 DIAGNOSIS — K5130 Ulcerative (chronic) rectosigmoiditis without complications: Secondary | ICD-10-CM | POA: Insufficient documentation

## 2020-03-20 DIAGNOSIS — R1032 Left lower quadrant pain: Secondary | ICD-10-CM | POA: Insufficient documentation

## 2020-03-20 DIAGNOSIS — K6389 Other specified diseases of intestine: Secondary | ICD-10-CM

## 2020-03-20 DIAGNOSIS — K529 Noninfective gastroenteritis and colitis, unspecified: Secondary | ICD-10-CM | POA: Insufficient documentation

## 2020-03-20 DIAGNOSIS — Z79899 Other long term (current) drug therapy: Secondary | ICD-10-CM | POA: Insufficient documentation

## 2020-03-20 DIAGNOSIS — R194 Change in bowel habit: Secondary | ICD-10-CM

## 2020-03-20 DIAGNOSIS — Z6835 Body mass index (BMI) 35.0-35.9, adult: Secondary | ICD-10-CM | POA: Insufficient documentation

## 2020-03-20 DIAGNOSIS — F17200 Nicotine dependence, unspecified, uncomplicated: Secondary | ICD-10-CM | POA: Insufficient documentation

## 2020-03-20 DIAGNOSIS — R634 Abnormal weight loss: Secondary | ICD-10-CM | POA: Insufficient documentation

## 2020-03-20 HISTORY — PX: COLONOSCOPY WITH PROPOFOL: SHX5780

## 2020-03-20 SURGERY — COLONOSCOPY WITH PROPOFOL
Anesthesia: General

## 2020-03-20 MED ORDER — PROPOFOL 500 MG/50ML IV EMUL
INTRAVENOUS | Status: DC | PRN
  Administered 2020-03-20: 120 ug/kg/min via INTRAVENOUS

## 2020-03-20 MED ORDER — PROPOFOL 500 MG/50ML IV EMUL
INTRAVENOUS | Status: AC
  Filled 2020-03-20: qty 50

## 2020-03-20 MED ORDER — FENTANYL CITRATE (PF) 100 MCG/2ML IJ SOLN
INTRAMUSCULAR | Status: AC
  Filled 2020-03-20: qty 2

## 2020-03-20 MED ORDER — FENTANYL CITRATE (PF) 100 MCG/2ML IJ SOLN
INTRAMUSCULAR | Status: DC | PRN
  Administered 2020-03-20: 50 ug via INTRAVENOUS

## 2020-03-20 MED ORDER — MIDAZOLAM HCL 2 MG/2ML IJ SOLN
INTRAMUSCULAR | Status: AC
  Filled 2020-03-20: qty 2

## 2020-03-20 MED ORDER — MIDAZOLAM HCL 2 MG/2ML IJ SOLN
INTRAMUSCULAR | Status: DC | PRN
  Administered 2020-03-20: 2 mg via INTRAVENOUS

## 2020-03-20 MED ORDER — SODIUM CHLORIDE 0.9 % IV SOLN
INTRAVENOUS | Status: DC
  Administered 2020-03-20: 07:00:00 1000 mL via INTRAVENOUS

## 2020-03-20 NOTE — Transfer of Care (Signed)
Immediate Anesthesia Transfer of Care Note  Patient: Crickett Abbett Darwin  Procedure(s) Performed: COLONOSCOPY WITH PROPOFOL (N/A )  Patient Location: PACU  Anesthesia Type:General  Level of Consciousness: awake and sedated  Airway & Oxygen Therapy: Patient Spontanous Breathing and Patient connected to nasal cannula oxygen  Post-op Assessment: Report given to RN and Post -op Vital signs reviewed and stable  Post vital signs: Reviewed and stable  Last Vitals:  Vitals Value Taken Time  BP 164/145 03/20/20 0803  Temp 36.4 C 03/20/20 0801  Pulse 80 03/20/20 0803  Resp 23 03/20/20 0803  SpO2 100 % 03/20/20 0803  Vitals shown include unvalidated device data.  Last Pain:  Vitals:   03/20/20 0801  TempSrc: Temporal  PainSc: 0-No pain         Complications: No apparent anesthesia complications

## 2020-03-20 NOTE — Anesthesia Postprocedure Evaluation (Signed)
Anesthesia Post Note  Patient: Victoria Chan  Procedure(s) Performed: COLONOSCOPY WITH PROPOFOL (N/A )  Patient location during evaluation: Endoscopy Anesthesia Type: General Level of consciousness: awake and alert and oriented Pain management: pain level controlled Vital Signs Assessment: post-procedure vital signs reviewed and stable Respiratory status: spontaneous breathing, nonlabored ventilation and respiratory function stable Cardiovascular status: blood pressure returned to baseline and stable Postop Assessment: no signs of nausea or vomiting Anesthetic complications: no     Last Vitals:  Vitals:   03/20/20 0821 03/20/20 0829  BP: 106/67 119/69  Pulse: 73 67  Resp: (!) 24 (!) 25  Temp:    SpO2: 100% 100%    Last Pain:  Vitals:   03/20/20 0829  TempSrc:   PainSc: 0-No pain                 Braxson Hollingsworth

## 2020-03-20 NOTE — H&P (Signed)
Lucilla Lame, MD [facility] [address] [place], [place] [address] [phone] Fax : [phone]  Primary Care Physician:  Ranae Plumber, [place] Primary Gastroenterologist:  Dr. Allen Norris  Pre-Procedure History & Physical: HPI:  Victoria Chan is a 54 y.o. female is here for an colonoscopy.   Past Medical History:  Diagnosis Date  . Murmur     Past Surgical History:  Procedure Laterality Date  . TUBAL LIGATION      Prior to Admission medications   Medication Sig Start Date End Date Taking? Authorizing Provider  dicyclomine (BENTYL) 10 MG capsule Take 1 capsule (10 mg total) by mouth 3 (three) times daily as needed for spasms (abdominal discomfort). 05/30/16 05/31/16  Harvest Dark, MD    Allergies as of 02/14/2020  . (No Known Allergies)    Family History  Problem Relation Age of Onset  . Heart disease Father     Social History   Socioeconomic History  . Marital status: Married    Spouse name: Not on file  . Number of children: Not on file  . Years of education: Not on file  . Highest education level: Not on file  Occupational History  . Not on file  Tobacco Use  . Smoking status: Current Every Day Smoker  . Smokeless tobacco: Never Used  Substance and Sexual Activity  . Alcohol use: Yes  . Drug use: No  . Sexual activity: Not on file  Other Topics Concern  . Not on file  Social History Narrative  . Not on file   Social Determinants of Health   Financial Resource Strain:   . Difficulty of Paying Living Expenses:   Food Insecurity:   . Worried About Charity fundraiser in the Last Year:   . Arboriculturist in the Last Year:   Transportation Needs:   . Film/video editor (Medical):   Marland Kitchen Lack of Transportation (Non-Medical):   Physical Activity:   . Days of Exercise per Week:   . Minutes of Exercise per Session:   Stress:   . Feeling of Stress :   Social Connections:   . Frequency of Communication with Friends and Family:   .  Frequency of Social Gatherings with Friends and Family:   . Attends Religious Services:   . Active Member of Clubs or Organizations:   . Attends Archivist Meetings:   Marland Kitchen Marital Status:   Intimate Partner Violence:   . Fear of Current or Ex-Partner:   . Emotionally Abused:   Marland Kitchen Physically Abused:   . Sexually Abused:     Review of Systems: See HPI, otherwise negative ROS  Physical Exam: BP 127/88   Pulse 88   Temp (!) 97.4 F (36.3 C) (Temporal)   Resp 20   Ht 5' 3"  (1.6 m)   Wt 90.7 kg   LMP 04/24/2016 Comment: neg preg test  SpO2 100%   BMI 35.43 kg/m  General:   Alert,  pleasant and cooperative in NAD Head:  Normocephalic and atraumatic. Neck:  Supple; no masses or thyromegaly. Lungs:  Clear throughout to auscultation.    Heart:  Regular rate and rhythm. Abdomen:  Soft, nontender and nondistended. Normal bowel sounds, without guarding, and without rebound.   Neurologic:  Alert and  oriented x4;  grossly normal neurologically.  Impression/Plan: Victoria Chan is here for an colonoscopy to be performed for weight loss and abdominal pain  Risks, benefits, limitations, and alternatives regarding  colonoscopy have been  reviewed with the patient.  Questions have been answered.  All parties agreeable.   Lucilla Lame, MD  03/20/2020, 7:27 AM

## 2020-03-20 NOTE — Anesthesia Preprocedure Evaluation (Signed)
Anesthesia Evaluation  Patient identified by MRN, date of birth, ID band Patient awake    Reviewed: Allergy & Precautions, NPO status , Patient's Chart, lab work & pertinent test results  History of Anesthesia Complications Negative for: history of anesthetic complications  Airway Mallampati: III  TM Distance: >3 FB Neck ROM: Full    Dental  (+) Poor Dentition   Pulmonary neg sleep apnea, neg COPD, Current SmokerPatient did not abstain from smoking.,    breath sounds clear to auscultation- rhonchi (-) wheezing      Cardiovascular Exercise Tolerance: Good (-) hypertension(-) CAD, (-) Past MI, (-) Cardiac Stents and (-) CABG  Rhythm:Regular Rate:Normal - Systolic murmurs and - Diastolic murmurs    Neuro/Psych neg Seizures negative neurological ROS  negative psych ROS   GI/Hepatic negative GI ROS, Neg liver ROS,   Endo/Other  negative endocrine ROSneg diabetes  Renal/GU negative Renal ROS     Musculoskeletal negative musculoskeletal ROS (+)   Abdominal (+) + obese,   Peds  Hematology negative hematology ROS (+)   Anesthesia Other Findings Past Medical History: No date: Murmur   Reproductive/Obstetrics                             Anesthesia Physical Anesthesia Plan  ASA: II  Anesthesia Plan: General   Post-op Pain Management:    Induction: Intravenous  PONV Risk Score and Plan: 1 and Propofol infusion  Airway Management Planned: Natural Airway  Additional Equipment:   Intra-op Plan:   Post-operative Plan:   Informed Consent: I have reviewed the patients History and Physical, chart, labs and discussed the procedure including the risks, benefits and alternatives for the proposed anesthesia with the patient or authorized representative who has indicated his/her understanding and acceptance.     Dental advisory given  Plan Discussed with: CRNA and  Anesthesiologist  Anesthesia Plan Comments:         Anesthesia Quick Evaluation

## 2020-03-20 NOTE — Op Note (Signed)
[facility] Gastroenterology Patient Name: Victoria Chan Procedure Date: 03/20/2020 7:27 AM MRN: [id number] Account #: [id number] Date of Birth: August 10, 1966 Admit Type: Outpatient Age: 54 Room: [facility] ENDO ROOM 4 Gender: Female Note Status: Finalized Procedure:             Colonoscopy Indications:           Abdominal pain in the left lower quadrant, Weight loss Providers:             Lucilla Lame MD, MD Referring MD:          No Local Md, MD (Referring MD) Medicines:             Propofol per Anesthesia Complications:         No immediate complications. Procedure:             Pre-Anesthesia Assessment:                        - Prior to the procedure, a History and Physical was                         performed, and patient medications and allergies were                         reviewed. The patient's tolerance of previous                         anesthesia was also reviewed. The risks and benefits                         of the procedure and the sedation options and risks                         were discussed with the patient. All questions were                         answered, and informed consent was obtained. Prior                         Anticoagulants: The patient has taken no previous                         anticoagulant or antiplatelet agents. ASA Grade                         Assessment: II - A patient with mild systemic disease.                         After reviewing the risks and benefits, the patient                         was deemed in satisfactory condition to undergo the                         procedure.                        After obtaining informed consent, the colonoscope was  passed under direct vision. Throughout the procedure,                         the patient's blood pressure, pulse, and oxygen                         saturations were monitored continuously. The                         Colonoscope was introduced  through the anus and                         advanced to the the terminal ileum. The colonoscopy                         was performed without difficulty. The patient                         tolerated the procedure well. The quality of the bowel                         preparation was excellent. Findings:      The perianal and digital rectal examinations were normal.      The terminal ileum appeared normal. Biopsies were taken with a cold       forceps for histology.      Diffuse severe inflammation characterized by serpentine ulcerations was       found at 40 cm proximal to the anus. Biopsies were taken with a cold       forceps for histology.      Left and right sised biopsies were obtained with cold forceps for       histology. Impression:            - The examined portion of the ileum was normal.                         Biopsied.                        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 Biopsied.                        - Left and right sised biopsies were obtained. Recommendation:        - Discharge patient to home.                        - Resume previous diet.                        - Continue present medications.                        - Await pathology results. Procedure Code(s):     --- Professional ---  45380, Colonoscopy, flexible; with biopsy, single or                         multiple Diagnosis Code(s):     --- Professional ---                        R10.32, Left lower quadrant pain                        R63.4, Abnormal weight loss                        K63.89, Other specified diseases of intestine CPT copyright 2019 American Medical Association. All rights reserved. The codes documented in this report are preliminary and upon coder review may  be revised to meet current compliance requirements. Lucilla Lame MD, MD 03/20/2020  8:01:10 AM This report has been signed electronically. Number of Addenda: 0 Note Initiated On: 03/20/2020 7:27 AM Scope Withdrawal Time: 0 hours 6 minutes 27 seconds  Total Procedure Duration: 0 hours 9 minutes 28 seconds  Estimated Blood Loss:  Estimated blood loss: none.      [facility]

## 2020-03-21 ENCOUNTER — Encounter: Payer: Self-pay | Admitting: *Deleted

## 2020-03-27 ENCOUNTER — Telehealth: Payer: Self-pay

## 2020-03-27 NOTE — Telephone Encounter (Signed)
-----   Message from Lucilla Lame, MD sent at 03/26/2020  1:07 PM EDT ----- This patient should be made to follow-up with Dr. Marius Ditch for her colitis.

## 2020-03-27 NOTE — Telephone Encounter (Signed)
Pt notified of results and has been referred to Dr. Marius Ditch for continuity of care of colitis.

## 2020-04-16 ENCOUNTER — Other Ambulatory Visit: Payer: Self-pay

## 2020-04-17 ENCOUNTER — Other Ambulatory Visit: Payer: Self-pay

## 2020-04-17 ENCOUNTER — Ambulatory Visit (INDEPENDENT_AMBULATORY_CARE_PROVIDER_SITE_OTHER): Payer: Self-pay | Admitting: Gastroenterology

## 2020-04-17 ENCOUNTER — Encounter: Payer: Self-pay | Admitting: Gastroenterology

## 2020-04-17 VITALS — BP 128/61 | HR 79 | Temp 98.1°F | Ht 63.0 in | Wt 192.2 lb

## 2020-04-17 DIAGNOSIS — K5150 Left sided colitis without complications: Secondary | ICD-10-CM

## 2020-04-17 MED ORDER — PREDNISONE 10 MG PO TABS: ORAL_TABLET | ORAL | 0 refills | Status: DC

## 2020-04-17 NOTE — Progress Notes (Signed)
[name], MD [address]  [place]  [place], [place] [address]  Main: [phone]  Fax: [phone]    Gastroenterology Consultation  Referring Provider:     Ranae Plumber, [place] Primary Care Physician:  Ranae Plumber, [place] Primary Gastroenterologist:  Dr. Lucilla Lame Reason for Consultation:     Left-sided colitis        HPI:   Victoria Chan is a 54 y.o. female referred by Dr. Ranae Plumber, [place]  for consultation & management of left-sided colitis.  Patient reports approximately 5 years history of relapsing and remitting symptoms of left lower abdominal pain associated with pinkish mucus mixed with soft mushy stools.  She has lost about 20 pounds within last few years and her weight continues to downtrend.  She reports good appetite, patient is recently evaluated by Dr. 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  Background chronicity was not mentioned.  Patient is therefore referred to me for further management Her most recent labs from 08/2019 revealed normal hemoglobin, normal CMP She smokes cigarettes 1 pack/day  NSAIDs: Unknown  Antiplts/Anticoagulants/Anti thrombotics: None Patient denies family history of IBD, GI malignancy  GI Procedures: Colonoscopy 03/20/2020 - The examined portion of the ileum was normal. Biopsied. - Diffuse severe inflammation was found at 40 cm proximal to the anus secondary to proctosigmoid colitis. Biopsied. - Left and right sised biopsies were obtained.  DIAGNOSIS:  A. TERMINAL ILEUM; COLD BIOPSY:  - ENTERIC MUCOSA WITH NO SIGNIFICANT PATHOLOGIC ALTERATION.  - NEGATIVE FOR INFLAMMATION, DYSPLASIA, AND MALIGNANCY.   B. COLON, RIGHT; COLD BIOPSY:  - COLONIC MUCOSA WITH NO SIGNIFICANT PATHOLOGIC ALTERATION.  - NEGATIVE FOR MICROSCOPIC COLITIS, DYSPLASIA, AND MALIGNANCY.   C. COLON, RECTOSIGMOID; COLD BIOPSY:  -  MODERATE ACTIVE PROCTOCOLITIS WITH ULCER EXUDATE.  - SEE COMMENT.   Comment:  Diagnostic considerations include acute self-limited / infectious  colitis, ischemia, medication effects, and inflammatory bowel disease.  Clinical correlation is recommended. There is no evidence of dysplasia  or malignancy.   Past Medical History:  Diagnosis Date  . Murmur     Past Surgical History:  Procedure Laterality Date  . COLONOSCOPY WITH PROPOFOL N/A 03/20/2020   Procedure: COLONOSCOPY WITH PROPOFOL;  Surgeon: Lucilla Lame, MD;  Location: [facility] ENDOSCOPY;  Service: Endoscopy;  Laterality: N/A;  . TUBAL LIGATION      Current Outpatient Medications:  .  predniSONE (DELTASONE) 10 MG tablet, Take 4 tabs once daily for 5 days, then decrease by 1 tab every 5 days until finished, Disp: 100 tablet, Rfl: 0    Family History  Problem Relation Age of Onset  . Heart disease Father      Social History   Tobacco Use  . Smoking status: Current Every Day Smoker  . Smokeless tobacco: Never Used  Substance Use Topics  . Alcohol use: Not Currently  . Drug use: No    Allergies as of 04/17/2020  . (No Known Allergies)    Review of Systems:    All systems reviewed and negative except where noted in HPI.   Physical Exam:  BP 128/61 (BP Location: Left Arm, Patient Position: Sitting, Cuff Size: Normal)   Pulse 79   Temp 98.1 F (36.7 C) (Oral)   Ht 5' 3"  (1.6 m)   Wt 192 lb 4 oz (87.2 kg)   LMP 04/24/2016 Comment: neg preg test  BMI 34.06 kg/m  Patient's last menstrual period was 04/24/2016.  General:   Alert,  Well-developed, well-nourished, pleasant and cooperative in NAD Head:  Normocephalic and atraumatic. Eyes:  Sclera clear, no icterus.   Conjunctiva pink. Ears:  Normal auditory acuity. Nose:  No deformity, discharge, or lesions. Mouth:  No deformity or lesions,oropharynx pink & moist. Neck:  Supple; no masses or thyromegaly. Lungs:  Respirations even and unlabored.  Clear throughout  to auscultation.   No wheezes, crackles, or rhonchi. No acute distress. Heart:  Regular rate and rhythm; no murmurs, clicks, rubs, or gallops. Abdomen:  Normal bowel sounds. Soft, mild left lower quadrant tenderness and non-distended without masses, hepatosplenomegaly or hernias noted.  No guarding or rebound tenderness.   Rectal: Not performed Msk:  Symmetrical without gross deformities. Good, equal movement & strength bilaterally. Pulses:  Normal pulses noted. Extremities:  No clubbing or edema.  No cyanosis. Neurologic:  Alert and oriented x3;  grossly normal neurologically. Skin:  Intact without significant lesions or rashes. No jaundice. Psych:  Alert and cooperative. Normal mood and affect.  Imaging Studies: Reviewed  Assessment and Plan:   Victoria Chan is a 54 y.o. Caucasian female with chronic tobacco use is seen for further evaluation of left-sided colitis.  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  There was no evidence of chronicity.  It is hard to determine whether these features are secondary to inflammatory bowel disease in the absence of chronicity.  Other differentials include chronic ischemic colitis given her history of chronic tobacco use are any viral pathology such as HSV or CMV.  Less likely infectious etiology  Recommend stool studies to evaluate for infection Check fecal calprotectin levels Check CRP Recheck CBC, CMP, vitamin D levels, iron studies, B12 and folate panel Check viral hepatitis panel, QuantiFERON gold levels Review pathology slides with the pathologist Trial of short course of prednisone after ruling out infection  Follow up in 4 weeks   [name], MD

## 2020-04-20 LAB — CBC
Hematocrit: 41.4 % (ref 34.0–46.6)
Hemoglobin: 13.9 g/dL (ref 11.1–15.9)
MCH: 30.3 pg (ref 26.6–33.0)
MCHC: 33.6 g/dL (ref 31.5–35.7)
MCV: 90 fL (ref 79–97)
Platelets: 129 10*3/uL — ABNORMAL LOW (ref 150–450)
RBC: 4.58 x10E6/uL (ref 3.77–5.28)
RDW: 12.6 % (ref 11.7–15.4)
WBC: 9.9 10*3/uL (ref 3.4–10.8)

## 2020-04-20 LAB — COMPREHENSIVE METABOLIC PANEL
ALT: 9 IU/L (ref 0–32)
AST: 11 IU/L (ref 0–40)
Albumin/Globulin Ratio: 1.5 (ref 1.2–2.2)
Albumin: 4.3 g/dL (ref 3.8–4.9)
Alkaline Phosphatase: 54 IU/L (ref 48–121)
BUN/Creatinine Ratio: 16 (ref 9–23)
BUN: 14 mg/dL (ref 6–24)
Bilirubin Total: 0.3 mg/dL (ref 0.0–1.2)
CO2: 24 mmol/L (ref 20–29)
Calcium: 9.8 mg/dL (ref 8.7–10.2)
Chloride: 101 mmol/L (ref 96–106)
Creatinine, Ser: 0.87 mg/dL (ref 0.57–1.00)
GFR calc Af Amer: 89 mL/min/{1.73_m2} (ref >59)
GFR calc non Af Amer: 77 mL/min/{1.73_m2} (ref >59)
Globulin, Total: 2.8 g/dL (ref 1.5–4.5)
Glucose: 110 mg/dL — ABNORMAL HIGH (ref 65–99)
Potassium: 4.4 mmol/L (ref 3.5–5.2)
Sodium: 140 mmol/L (ref 134–144)
Total Protein: 7.1 g/dL (ref 6.0–8.5)

## 2020-04-20 LAB — HEPATITIS PANEL, ACUTE
Hep A IgM: NEGATIVE
Hep B C IgM: NEGATIVE
Hep C Virus Ab: <0.1 s/co ratio (ref 0.0–0.9)
Hepatitis B Surface Ag: NEGATIVE

## 2020-04-20 LAB — C-REACTIVE PROTEIN: CRP: 9 mg/L (ref 0–10)

## 2020-04-20 LAB — B12 AND FOLATE PANEL
Folate: 9.4 ng/mL (ref >3.0)
Vitamin B-12: 397 pg/mL (ref 232–1245)

## 2020-04-20 LAB — IRON,TIBC AND FERRITIN PANEL
Ferritin: 176 ng/mL — ABNORMAL HIGH (ref 15–150)
Iron Saturation: 25 % (ref 15–55)
Iron: 74 ug/dL (ref 27–159)
Total Iron Binding Capacity: 292 ug/dL (ref 250–450)
UIBC: 218 ug/dL (ref 131–425)

## 2020-04-20 LAB — QUANTIFERON-TB GOLD PLUS
QuantiFERON Mitogen Value: 0 IU/mL
QuantiFERON Nil Value: 0 IU/mL
QuantiFERON TB1 Ag Value: 0 IU/mL
QuantiFERON TB2 Ag Value: 0 IU/mL
QuantiFERON-TB Gold Plus: UNDETERMINED — AB

## 2020-04-20 LAB — SURGICAL PATHOLOGY

## 2020-04-20 LAB — VITAMIN D 25 HYDROXY (VIT D DEFICIENCY, FRACTURES): Vit D, 25-Hydroxy: 44.9 ng/mL (ref 30.0–100.0)

## 2020-04-24 ENCOUNTER — Telehealth: Payer: Self-pay

## 2020-04-24 DIAGNOSIS — Z111 Encounter for screening for respiratory tuberculosis: Secondary | ICD-10-CM

## 2020-04-24 NOTE — Telephone Encounter (Signed)
Tried to call patient number kept ringing. Sent mychart message

## 2020-04-24 NOTE — Telephone Encounter (Signed)
-----   Message from Lin Landsman, MD sent at 04/23/2020  4:50 PM EDT ----- QuantiFERON gold indeterminate. Recommend chest x-ray  RV

## 2020-04-26 LAB — GI PROFILE, STOOL, PCR

## 2020-04-27 LAB — CALPROTECTIN, FECAL: Calprotectin, Fecal: 790 ug/g — ABNORMAL HIGH (ref 0–120)

## 2020-04-30 ENCOUNTER — Telehealth: Payer: Self-pay

## 2020-04-30 NOTE — Telephone Encounter (Signed)
Patient verbalized understanding of results and made follow up appointment

## 2020-04-30 NOTE — Telephone Encounter (Signed)
-----   Message from Lin Landsman, MD sent at 04/29/2020  2:53 PM EDT ----- Significantly elevated fecal calprotectin levels, suggestive of ulcerative colitis.  Please arrange a virtual visit or in person visit next week to discuss further management options  Victoria Chan

## 2020-05-08 ENCOUNTER — Encounter: Payer: Self-pay | Admitting: Gastroenterology

## 2020-05-08 ENCOUNTER — Other Ambulatory Visit: Payer: Self-pay | Admitting: Gastroenterology

## 2020-05-08 ENCOUNTER — Other Ambulatory Visit: Payer: Self-pay

## 2020-05-08 ENCOUNTER — Ambulatory Visit (INDEPENDENT_AMBULATORY_CARE_PROVIDER_SITE_OTHER): Payer: Self-pay | Admitting: Gastroenterology

## 2020-05-08 VITALS — BP 124/67 | HR 85 | Temp 97.9°F | Wt 196.1 lb

## 2020-05-08 DIAGNOSIS — K5150 Left sided colitis without complications: Secondary | ICD-10-CM

## 2020-05-08 MED ORDER — MESALAMINE 1.2 G PO TBEC: 4.8000 g | DELAYED_RELEASE_TABLET | Freq: Two times a day (BID) | ORAL | 3 refills | Status: DC

## 2020-05-08 NOTE — Progress Notes (Signed)
[name], MD [address]  [place]  [place], [place] [address]  Main: [phone]  Fax: [phone]    Gastroenterology Consultation  Referring Provider:     Ranae Plumber, [place] Primary Care Physician:  Ranae Plumber, [place] Primary Gastroenterologist:  Dr. Lucilla Lame Reason for Consultation:     Left-sided colitis, ulcerative colitis        HPI:   Victoria Chan is a 54 y.o. female referred by Dr. Ranae Plumber, [place]  for consultation & management of left-sided colitis.  Patient reports approximately 5 years history of relapsing and remitting symptoms of left lower abdominal pain associated with pinkish mucus mixed with soft mushy stools.  She has lost about 20 pounds within last few years and her weight continues to downtrend.  She reports good appetite, patient is recently evaluated by Dr. 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  Background chronicity was not mentioned.  Patient is therefore referred to me for further management Her most recent labs from 08/2019 revealed normal hemoglobin, normal CMP She smokes cigarettes 1 pack/day  Follow-up visit 05/08/2020 Patient responded very well to prednisone course.  She is currently on 10 mg once a day for past 5 days.  She reports her abdominal pain and diarrhea, rectal bleeding have resolved.  She states that she does not recollect when it was last time that she had pain.  She did not tolerate prednisone well, she felt irritable, nervousness, lack of sleep, increased appetite.  She has significantly elevated fecal calprotectin levels 790  NSAIDs: Unknown  Antiplts/Anticoagulants/Anti thrombotics: None Patient denies family history of IBD, GI malignancy  GI Procedures: Colonoscopy 03/20/2020 - The examined portion of the ileum was normal. Biopsied. - Diffuse severe inflammation was found at 40 cm proximal to  the anus secondary to proctosigmoid colitis. Biopsied. - Left and right sised biopsies were obtained.  DIAGNOSIS:  A. TERMINAL ILEUM; COLD BIOPSY:  - ENTERIC MUCOSA WITH NO SIGNIFICANT PATHOLOGIC ALTERATION.  - NEGATIVE FOR INFLAMMATION, DYSPLASIA, AND MALIGNANCY.   B. COLON, RIGHT; COLD BIOPSY:  - COLONIC MUCOSA WITH NO SIGNIFICANT PATHOLOGIC ALTERATION.  - NEGATIVE FOR MICROSCOPIC COLITIS, DYSPLASIA, AND MALIGNANCY.   C. COLON, RECTOSIGMOID; COLD BIOPSY:  - MODERATE ACTIVE PROCTOCOLITIS WITH ULCER EXUDATE.  - SEE COMMENT.   Comment:  Diagnostic considerations include acute self-limited / infectious  colitis, ischemia, medication effects, and inflammatory bowel disease.  Clinical correlation is recommended. There is no evidence of dysplasia  or malignancy.   Past Medical History:  Diagnosis Date  . Murmur     Past Surgical History:  Procedure Laterality Date  . COLONOSCOPY WITH PROPOFOL N/A 03/20/2020   Procedure: COLONOSCOPY WITH PROPOFOL;  Surgeon: Lucilla Lame, MD;  Location: [facility] ENDOSCOPY;  Service: Endoscopy;  Laterality: N/A;  . TUBAL LIGATION      Current Outpatient Medications:  .  predniSONE (DELTASONE) 10 MG tablet, Take 4 tabs once daily for 5 days, then decrease by 1 tab every 5 days until finished, Disp: 100 tablet, Rfl: 0 .  mesalamine (LIALDA) 1.2 g EC tablet, Take 4 tablets (4.8 g total) by mouth 2 (two) times daily., Disp: 240 tablet, Rfl: 3    Family History  Problem Relation Age of Onset  . Heart disease Father      Social History   Tobacco Use  . Smoking status: Current Every Day Smoker  . Smokeless tobacco: Never Used  Substance Use Topics  .  Alcohol use: Not Currently  . Drug use: No    Allergies as of 05/08/2020  . (No Known Allergies)    Review of Systems:    All systems reviewed and negative except where noted in HPI.   Physical Exam:  BP 124/67 (BP Location: Left Arm, Patient Position: Sitting, Cuff Size: Normal)   Pulse 85    Temp 97.9 F (36.6 C) (Oral)   Wt 196 lb 2 oz (89 kg)   LMP 04/24/2016 Comment: neg preg test  BMI 34.74 kg/m  Patient's last menstrual period was 04/24/2016.  General:   Alert,  Well-developed, well-nourished, pleasant and cooperative in NAD Head:  Normocephalic and atraumatic. Eyes:  Sclera clear, no icterus.   Conjunctiva pink. Ears:  Normal auditory acuity. Nose:  No deformity, discharge, or lesions. Mouth:  No deformity or lesions,oropharynx pink & moist. Neck:  Supple; no masses or thyromegaly. Lungs:  Respirations even and unlabored.  Clear throughout to auscultation.   No wheezes, crackles, or rhonchi. No acute distress. Heart:  Regular rate and rhythm; no murmurs, clicks, rubs, or gallops. Abdomen:  Normal bowel sounds. Soft, mild left lower quadrant tenderness and non-distended without masses, hepatosplenomegaly or hernias noted.  No guarding or rebound tenderness.   Rectal: Not performed Msk:  Symmetrical without gross deformities. Good, equal movement & strength bilaterally. Pulses:  Normal pulses noted. Extremities:  No clubbing or edema.  No cyanosis. Neurologic:  Alert and oriented x3;  grossly normal neurologically. Skin:  Intact without significant lesions or rashes. No jaundice. Psych:  Alert and cooperative. Normal mood and affect.  Imaging Studies: Reviewed  Assessment and Plan:   Victoria Chan is a 54 y.o. Caucasian female with chronic tobacco use is seen for follow-up of moderate to severe left-sided colitis. 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  There was no evidence of chronicity.  Stool studies were negative for infection, normal CRP levels.  Patient has elevated fecal calprotectin levels.  She responded very well to prednisone, resulted in clinical remission.  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  Patient does not have insurance and she would like to try oral medication at this time Recommend Lialda 4.8 g daily, if she fails, will initiate Biologics through patient assistance program.  Also, discussed with her about [facility] care program if she needs biologic therapy Patient is not up-to-date with her preventive care, recommended to undergo mammogram and Pap smear, follow-up with PCP 20: Gold indeterminate, recommend chest x-ray, ordered, reminded patient about it today Acute viral hepatitis panel negative Quit smoking  Follow up in 2 months   [name], MD

## 2020-05-08 NOTE — Patient Instructions (Signed)
Humira Assistance program is at [email or web address] Username is email.

## 2020-05-08 NOTE — Telephone Encounter (Signed)
Called and asked patient if she could go to have xray done today before her appointment at 1. Patient states she will not have time. She will go ASAP

## 2020-05-08 NOTE — Telephone Encounter (Signed)
Last office visit 04/17/2020 Left sided colitis  Last refill 04/17/2020

## 2020-05-16 ENCOUNTER — Ambulatory Visit: Payer: Self-pay | Admitting: Gastroenterology

## 2020-07-26 ENCOUNTER — Ambulatory Visit: Payer: Self-pay | Admitting: Gastroenterology

## 2020-10-02 ENCOUNTER — Ambulatory Visit (INDEPENDENT_AMBULATORY_CARE_PROVIDER_SITE_OTHER): Payer: Self-pay | Admitting: Gastroenterology

## 2020-10-02 ENCOUNTER — Encounter: Payer: Self-pay | Admitting: Gastroenterology

## 2020-10-02 ENCOUNTER — Other Ambulatory Visit: Payer: Self-pay

## 2020-10-02 VITALS — BP 142/77 | HR 84 | Temp 97.8°F | Ht 63.0 in | Wt 200.5 lb

## 2020-10-02 DIAGNOSIS — K5150 Left sided colitis without complications: Secondary | ICD-10-CM

## 2020-10-02 DIAGNOSIS — K51511 Left sided colitis with rectal bleeding: Secondary | ICD-10-CM

## 2020-10-02 NOTE — Progress Notes (Signed)
Victoria Darby, MD [address]  [place]  [place], [place] [address]  Main: [phone]  Fax: [phone]    Gastroenterology Consultation  Referring Provider:     Ranae Plumber, [place] Primary Care Physician:  Ranae Plumber, [place] Primary Gastroenterologist:  Dr. Lucilla Lame Reason for Consultation:     Left-sided colitis, ulcerative colitis        HPI:   Victoria Chan is a 54 y.o. female referred by Dr. Ranae Plumber, [place]  for consultation & management of left-sided colitis.  Patient reports approximately 5 years history of relapsing and remitting symptoms of left lower abdominal pain associated with pinkish mucus mixed with soft mushy stools.  She has lost about 20 pounds within last few years and her weight continues to downtrend.  She reports good appetite, patient is recently evaluated by Dr. 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  Background chronicity was not mentioned.  Patient is therefore referred to me for further management Her most recent labs from 08/2019 revealed normal hemoglobin, normal CMP She smokes cigarettes 1 pack/day  Follow-up visit 05/08/2020 Patient responded very well to prednisone course.  She is currently on 10 mg once a day for past 5 days.  She reports her abdominal pain and diarrhea, rectal bleeding have resolved.  She states that she does not recollect when it was last time that she had pain.  She did not tolerate prednisone well, she felt irritable, nervousness, lack of sleep, increased appetite.  She has significantly elevated fecal calprotectin levels 790  Follow-up visit 10/02/2020 Patient reports doing well off prednisone and currently on Lialda 1.2 g 4 tablets 2 times daily.  The prescription for Lialda was mistakenly sent for 8 tablets a day.  I have discussed with patient and clarified that she is supposed to be taking only 4  tablets daily and she understands it.  She is currently receiving Lialda prescription from [facility] clinic pharmacy. She reports that she had mild left lower quadrant discomfort few months ago for which she took prednisone for about 3 to 5 days.  She reports having 1-2 formed bowel movements daily, denies abdominal pain, blood and mucus in stool.  Her weight is stable.  She continues to smoke cigarettes, currently 1 pack/day.  She tried for [facility] health charity care, did not qualify for it.  Patient does not have any other concerns today  NSAIDs: Unknown  Antiplts/Anticoagulants/Anti thrombotics: None Patient denies family history of IBD, GI malignancy  GI Procedures: Colonoscopy 03/20/2020 - The examined portion of the ileum was normal. Biopsied. - Diffuse severe inflammation was found at 40 cm proximal to the anus secondary to proctosigmoid colitis. Biopsied. - Left and right sised biopsies were obtained.  DIAGNOSIS:  A. TERMINAL ILEUM; COLD BIOPSY:  - ENTERIC MUCOSA WITH NO SIGNIFICANT PATHOLOGIC ALTERATION.  - NEGATIVE FOR INFLAMMATION, DYSPLASIA, AND MALIGNANCY.   B. COLON, RIGHT; COLD BIOPSY:  - COLONIC MUCOSA WITH NO SIGNIFICANT PATHOLOGIC ALTERATION.  - NEGATIVE FOR MICROSCOPIC COLITIS, DYSPLASIA, AND MALIGNANCY.   C. COLON, RECTOSIGMOID; COLD BIOPSY:  - MODERATE ACTIVE PROCTOCOLITIS WITH ULCER EXUDATE.  - SEE COMMENT.   Comment:  Diagnostic considerations include acute self-limited / infectious  colitis, ischemia, medication effects, and inflammatory bowel disease.  Clinical correlation is recommended. There is no evidence of dysplasia  or malignancy.   Past Medical History:  Diagnosis Date  . Loss of weight   . Murmur  Past Surgical History:  Procedure Laterality Date  . COLONOSCOPY WITH PROPOFOL N/A 03/20/2020   Procedure: COLONOSCOPY WITH PROPOFOL;  Surgeon: Lucilla Lame, MD;  Location: [facility] ENDOSCOPY;  Service: Endoscopy;  Laterality: N/A;  . TUBAL LIGATION       Current Outpatient Medications:  .  mesalamine (LIALDA) 1.2 g EC tablet, Take 4 tablets (4.8 g total) by mouth 2 (two) times daily., Disp: 240 tablet, Rfl: 3 .  predniSONE (DELTASONE) 10 MG tablet, Take 4 tabs once daily for 5 days, then decrease by 1 tab every 5 days until finished, Disp: 100 tablet, Rfl: 0    Family History  Problem Relation Age of Onset  . Heart disease Father      Social History   Tobacco Use  . Smoking status: Current Every Day Smoker  . Smokeless tobacco: Never Used  Substance Use Topics  . Alcohol use: Not Currently  . Drug use: No    Allergies as of 10/02/2020  . (No Known Allergies)    Review of Systems:    All systems reviewed and negative except where noted in HPI.   Physical Exam:  BP (!) 142/77 (BP Location: Left Arm, Patient Position: Sitting, Cuff Size: Normal)   Pulse 84   Temp 97.8 F (36.6 C) (Oral)   Ht 5' 3"  (1.6 m)   Wt 200 lb 8 oz (90.9 kg)   LMP 04/24/2016 Comment: neg preg test  BMI 35.52 kg/m  Patient's last menstrual period was 04/24/2016.  General:   Alert,  Well-developed, well-nourished, pleasant and cooperative in NAD Head:  Normocephalic and atraumatic. Eyes:  Sclera clear, no icterus.   Conjunctiva pink. Ears:  Normal auditory acuity. Nose:  No deformity, discharge, or lesions. Mouth:  No deformity or lesions,oropharynx pink & moist. Neck:  Supple; no masses or thyromegaly. Lungs:  Respirations even and unlabored.  Clear throughout to auscultation.   No wheezes, crackles, or rhonchi. No acute distress. Heart:  Regular rate and rhythm; no murmurs, clicks, rubs, or gallops. Abdomen:  Normal bowel sounds. Soft, mild left lower quadrant tenderness and non-distended without masses, hepatosplenomegaly or hernias noted.  No guarding or rebound tenderness.   Rectal: Not performed Msk:  Symmetrical without gross deformities. Good, equal movement & strength bilaterally. Pulses:  Normal pulses noted. Extremities:  No  clubbing or edema.  No cyanosis. Neurologic:  Alert and oriented x3;  grossly normal neurologically. Skin:  Intact without significant lesions or rashes. No jaundice. Psych:  Alert and cooperative. Normal mood and affect.  Imaging Studies: Reviewed  Assessment and Plan:   Victoria Chan is a 54 y.o. Caucasian female with chronic tobacco use is seen for follow-up of moderate to severe left-sided colitis. 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  There was no evidence of chronicity.  Stool studies were negative for infection, normal CRP levels.  Patient has elevated fecal calprotectin levels.  She responded very well to prednisone, resulted in clinical remission.  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  Patient does not have insurance and she would like to try oral medication at this time.  Patient is on Shevlin since 04/2020.  Clarified on the dosing today.  She will continue 4.8 g daily Check CBC, CMP,  fecal calprotectin levels.  If fecal calprotectin levels are not improving, recommend flexible sigmoidoscopy to assess severity of inflammation  Quantiferon Gold indeterminate, can repeat in future if plan is to start biologic therapy.  Patient may need chest x-ray Acute viral hepatitis panel negative Discussed about abstinence from smoking  Follow up in 6 months   Victoria Darby, MD

## 2020-10-02 NOTE — Patient Instructions (Signed)
Please take Mesalmine 1.2g 2 tablets twice a day

## 2020-10-06 LAB — COMPREHENSIVE METABOLIC PANEL WITH GFR
ALT: 9 IU/L (ref 0–32)
AST: 10 IU/L (ref 0–40)
Albumin/Globulin Ratio: 1.7 (ref 1.2–2.2)
Albumin: 4.3 g/dL (ref 3.8–4.9)
Alkaline Phosphatase: 49 IU/L (ref 44–121)
BUN/Creatinine Ratio: 19 (ref 9–23)
BUN: 16 mg/dL (ref 6–24)
Bilirubin Total: <0.2 mg/dL (ref 0.0–1.2)
CO2: 26 mmol/L (ref 20–29)
Calcium: 9.7 mg/dL (ref 8.7–10.2)
Chloride: 103 mmol/L (ref 96–106)
Creatinine, Ser: 0.85 mg/dL (ref 0.57–1.00)
GFR calc Af Amer: 91 mL/min/1.73
GFR calc non Af Amer: 79 mL/min/1.73
Globulin, Total: 2.6 g/dL (ref 1.5–4.5)
Glucose: 118 mg/dL — ABNORMAL HIGH (ref 65–99)
Potassium: 4.8 mmol/L (ref 3.5–5.2)
Sodium: 141 mmol/L (ref 134–144)
Total Protein: 6.9 g/dL (ref 6.0–8.5)

## 2020-10-06 LAB — CBC
Hematocrit: 39.7 % (ref 34.0–46.6)
Hemoglobin: 13.3 g/dL (ref 11.1–15.9)
MCH: 29.9 pg (ref 26.6–33.0)
MCHC: 33.5 g/dL (ref 31.5–35.7)
MCV: 89 fL (ref 79–97)
Platelets: 255 10*3/uL (ref 150–450)
RBC: 4.45 x10E6/uL (ref 3.77–5.28)
RDW: 12.4 % (ref 11.7–15.4)
WBC: 7.9 10*3/uL (ref 3.4–10.8)

## 2020-11-26 ENCOUNTER — Telehealth: Payer: Self-pay | Admitting: Gastroenterology

## 2020-11-26 DIAGNOSIS — K5150 Left sided colitis without complications: Secondary | ICD-10-CM

## 2020-11-26 MED ORDER — MESALAMINE 1.2 G PO TBEC: 4.8000 g | DELAYED_RELEASE_TABLET | Freq: Two times a day (BID) | ORAL | 3 refills | Status: DC

## 2020-11-26 NOTE — Telephone Encounter (Signed)
Last office visit 10/02/2020 Left sided colitis  Last refill 05/08/2020 3 refills No appointment is scheduled

## 2020-11-26 NOTE — Telephone Encounter (Signed)
Patient states she needs a refill of medication lialda sent to [facility].

## 2021-04-09 ENCOUNTER — Encounter: Payer: Self-pay | Admitting: Gastroenterology

## 2021-04-09 ENCOUNTER — Ambulatory Visit: Payer: Self-pay | Admitting: Gastroenterology

## 2021-04-09 ENCOUNTER — Other Ambulatory Visit: Payer: Self-pay

## 2021-04-09 VITALS — BP 143/77 | HR 81 | Temp 97.7°F | Ht 63.0 in | Wt 205.0 lb

## 2021-04-09 DIAGNOSIS — K5190 Ulcerative colitis, unspecified, without complications: Secondary | ICD-10-CM

## 2021-04-09 NOTE — Patient Instructions (Signed)
2

## 2021-04-09 NOTE — Progress Notes (Signed)
Victoria Darby, MD [address]  [place], [place] [address]  Main: [phone]  Fax: [phone]    Gastroenterology Consultation  Referring Provider:     Ranae Plumber, [place] Primary Care Physician:  Ranae Plumber, [place] Primary Gastroenterologist:  Dr. Lucilla Lame Reason for Consultation:     Left-sided colitis, ulcerative colitis        HPI:   Henretter Piekarski Royer is a 55 y.o. female referred by Dr. Ranae Plumber, [place]  for consultation & management of left-sided colitis.  Patient reports approximately 5 years history of relapsing and remitting symptoms of left lower abdominal pain associated with pinkish mucus mixed with soft mushy stools.  She has lost about 20 pounds within last few years and her weight continues to downtrend.  She reports good appetite, patient is recently evaluated by Dr. 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  Background chronicity was not mentioned.  Patient is therefore referred to me for further management Her most recent labs from 08/2019 revealed normal hemoglobin, normal CMP She smokes cigarettes 1 pack/day  Follow-up visit 05/08/2020 Patient responded very well to prednisone course.  She is currently on 10 mg once a day for past 5 days.  She reports her abdominal pain and diarrhea, rectal bleeding have resolved.  She states that she does not recollect when it was last time that she had pain.  She did not tolerate prednisone well, she felt irritable, nervousness, lack of sleep, increased appetite.  She has significantly elevated fecal calprotectin levels 790  Follow-up visit 10/02/2020 Patient reports doing well off prednisone and currently on [place] 1.2 g 4 tablets 2 times daily.  The prescription for Lialda was mistakenly sent for 8 tablets a day.  I have discussed with patient and clarified that she is supposed to be taking only 4  tablets daily and she understands it.  She is currently receiving Lialda prescription from [facility] clinic pharmacy. She reports that she had mild left lower quadrant discomfort few months ago for which she took prednisone for about 3 to 5 days.  She reports having 1-2 formed bowel movements daily, denies abdominal pain, blood and mucus in stool.  Her weight is stable.  She continues to smoke cigarettes, currently 1 pack/day.  She tried for [facility] health charity care, did not qualify for it.  Patient does not have any other concerns today  Follow-up visit 04/09/2021 Patient is here for follow-up of her ulcerative colitis.  She reports ongoing abdominal pain, predominantly in the left side.  She currently has insurance and she thinks Lialda has not been helping much.  She does report loose stools without any blood.  She is compliant with Lialda 2.4 g twice daily she is gaining weight.  She continues to smoke cigarettes.  NSAIDs: Unknown  Antiplts/Anticoagulants/Anti thrombotics: None Patient denies family history of IBD, GI malignancy  GI Procedures: Colonoscopy 03/20/2020 - The examined portion of the ileum was normal. Biopsied. - Diffuse severe inflammation was found at 40 cm proximal to the anus secondary to proctosigmoid colitis. Biopsied. - Left and right sised biopsies were obtained.  DIAGNOSIS:  A. TERMINAL ILEUM; COLD BIOPSY:  - ENTERIC MUCOSA WITH NO SIGNIFICANT PATHOLOGIC ALTERATION.  - NEGATIVE FOR INFLAMMATION, DYSPLASIA, AND MALIGNANCY.   B. COLON, RIGHT; COLD BIOPSY:  - COLONIC MUCOSA WITH NO SIGNIFICANT PATHOLOGIC ALTERATION.  - NEGATIVE FOR MICROSCOPIC COLITIS, DYSPLASIA, AND MALIGNANCY.   C. COLON,  RECTOSIGMOID; COLD BIOPSY:  - MODERATE ACTIVE PROCTOCOLITIS WITH ULCER EXUDATE.  - SEE COMMENT.   Comment:  Diagnostic considerations include acute self-limited / infectious  colitis, ischemia, medication effects, and inflammatory bowel disease.  Clinical correlation is  recommended. There is no evidence of dysplasia  or malignancy.   Past Medical History:  Diagnosis Date  . Loss of weight   . Murmur     Past Surgical History:  Procedure Laterality Date  . COLONOSCOPY WITH PROPOFOL N/A 03/20/2020   Procedure: COLONOSCOPY WITH PROPOFOL;  Surgeon: Lucilla Lame, MD;  Location: [facility] ENDOSCOPY;  Service: Endoscopy;  Laterality: N/A;  . TUBAL LIGATION      Current Outpatient Medications:  .  mesalamine (LIALDA) 1.2 g EC tablet, Take 4 tablets (4.8 g total) by mouth 2 (two) times daily., Disp: 240 tablet, Rfl: 3 .  predniSONE (DELTASONE) 10 MG tablet, Take 4 tabs once daily for 5 days, then decrease by 1 tab every 5 days until finished, Disp: 100 tablet, Rfl: 0    Family History  Problem Relation Age of Onset  . Heart disease Father      Social History   Tobacco Use  . Smoking status: Current Every Day Smoker  . Smokeless tobacco: Never Used  Substance Use Topics  . Alcohol use: Not Currently  . Drug use: No    Allergies as of 04/09/2021  . (No Known Allergies)    Review of Systems:    All systems reviewed and negative except where noted in HPI.   Physical Exam:  BP (!) 143/77 (BP Location: Left Arm, Patient Position: Sitting, Cuff Size: Normal)   Pulse 81   Temp 97.7 F (36.5 C) (Oral)   Ht 5' 3"  (1.6 m)   Wt 205 lb (93 kg)   LMP 04/24/2016 Comment: neg preg test  BMI 36.31 kg/m  Patient's last menstrual period was 04/24/2016.  General:   Alert,  Well-developed, well-nourished, pleasant and cooperative in NAD Head:  Normocephalic and atraumatic. Eyes:  Sclera clear, no icterus.   Conjunctiva pink. Ears:  Normal auditory acuity. Nose:  No deformity, discharge, or lesions. Mouth:  No deformity or lesions,oropharynx pink & moist. Neck:  Supple; no masses or thyromegaly. Lungs:  Respirations even and unlabored.  Clear throughout to auscultation.   No wheezes, crackles, or rhonchi. No acute distress. Heart:  Regular rate and  rhythm; no murmurs, clicks, rubs, or gallops. Abdomen:  Normal bowel sounds. Soft, mild left lower quadrant tenderness and non-distended without masses, hepatosplenomegaly or hernias noted.  No guarding or rebound tenderness.   Rectal: Not performed Msk:  Symmetrical without gross deformities. Good, equal movement & strength bilaterally. Pulses:  Normal pulses noted. Extremities:  No clubbing or edema.  No cyanosis. Neurologic:  Alert and oriented x3;  grossly normal neurologically. Skin:  Intact without significant lesions or rashes. No jaundice. Psych:  Alert and cooperative. Normal mood and affect.  Imaging Studies: Reviewed  Assessment and Plan:   Jem [place] Wardrip is a 55 y.o. Caucasian female with chronic tobacco use is seen for follow-up of moderate to severe left-sided colitis. 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  There was no evidence of chronicity.  Stool studies were negative for infection, normal CRP levels.  Patient has elevated fecal calprotectin levels.  She responded very well to prednisone, resulted in clinical remission.  Therefore, I will treat her condition as left-sided ulcerative colitis  Left-sided ulcerative colitis: Moderate to severe, uncontrolled Steroid responsive, in the past S/p 20 days  course of prednisone Discussed with patient about steroid sparing therapy including anti-TNF such as Humira, Remicade as well as anti-Integrins such as Entyvio, including risks and benefits.  Patient decided to try Humira as a first-line therapy biologic. Patient is on [place] since 04/2020,  She will continue 4.8 g daily for now Check CBC, CMP, fecal calprotectin levels, CRP, GI profile PCR to rule out infection Recommend repeat colonoscopy to assess progression of the disease and severity of the disease  Quantiferon Gold indeterminate,  repeat today, patient is currently off prednisone.  If still indeterminate, she will need a chest x-ray Acute viral  hepatitis panel negative Discussed about abstinence from smoking  Follow up in 2 months   Victoria Darby, MD

## 2021-04-10 ENCOUNTER — Encounter: Payer: Self-pay | Admitting: Gastroenterology

## 2021-04-14 LAB — QUANTIFERON-TB GOLD PLUS
QuantiFERON Mitogen Value: 9.7 IU/mL
QuantiFERON Nil Value: 0.06 IU/mL
QuantiFERON TB1 Ag Value: 0.12 IU/mL
QuantiFERON TB2 Ag Value: 0.07 IU/mL
QuantiFERON-TB Gold Plus: NEGATIVE

## 2021-04-14 LAB — CBC
Hematocrit: 40.6 % (ref 34.0–46.6)
Hemoglobin: 14.1 g/dL (ref 11.1–15.9)
MCH: 30.5 pg (ref 26.6–33.0)
MCHC: 34.7 g/dL (ref 31.5–35.7)
MCV: 88 fL (ref 79–97)
Platelets: 265 10*3/uL (ref 150–450)
RBC: 4.62 x10E6/uL (ref 3.77–5.28)
RDW: 12.4 % (ref 11.7–15.4)
WBC: 9.1 10*3/uL (ref 3.4–10.8)

## 2021-04-14 LAB — COMPREHENSIVE METABOLIC PANEL
ALT: 11 IU/L (ref 0–32)
AST: 11 IU/L (ref 0–40)
Albumin/Globulin Ratio: 1.5 (ref 1.2–2.2)
Albumin: 4.3 g/dL (ref 3.8–4.9)
Alkaline Phosphatase: 50 IU/L (ref 44–121)
BUN/Creatinine Ratio: 16 (ref 9–23)
BUN: 14 mg/dL (ref 6–24)
Bilirubin Total: 0.2 mg/dL (ref 0.0–1.2)
CO2: 22 mmol/L (ref 20–29)
Calcium: 9.7 mg/dL (ref 8.7–10.2)
Chloride: 103 mmol/L (ref 96–106)
Creatinine, Ser: 0.85 mg/dL (ref 0.57–1.00)
Globulin, Total: 2.9 g/dL (ref 1.5–4.5)
Glucose: 93 mg/dL (ref 65–99)
Potassium: 4.9 mmol/L (ref 3.5–5.2)
Sodium: 140 mmol/L (ref 134–144)
Total Protein: 7.2 g/dL (ref 6.0–8.5)
eGFR: 82 mL/min/{1.73_m2} (ref >59)

## 2021-04-14 LAB — C-REACTIVE PROTEIN: CRP: 5 mg/L (ref 0–10)

## 2021-04-15 ENCOUNTER — Encounter: Payer: Self-pay | Admitting: Gastroenterology

## 2021-04-15 ENCOUNTER — Telehealth: Payer: Self-pay

## 2021-04-15 LAB — GI PROFILE, STOOL, PCR

## 2021-04-15 LAB — CALPROTECTIN, FECAL: Calprotectin, Fecal: 224 ug/g — ABNORMAL HIGH (ref 0–120)

## 2021-04-15 NOTE — Telephone Encounter (Signed)
Humira is getting transfer to [place]

## 2021-05-03 ENCOUNTER — Telehealth: Payer: Self-pay | Admitting: Gastroenterology

## 2021-05-03 NOTE — Telephone Encounter (Signed)
Can we call in the maintenance dose of Humira

## 2021-05-06 ENCOUNTER — Telehealth: Payer: Self-pay | Admitting: Gastroenterology

## 2021-05-06 MED ORDER — HUMIRA (2 PEN) 40 MG/0.4ML ~~LOC~~ AJKT: 40.0000 mg | AUTO-INJECTOR | SUBCUTANEOUS | 3 refills | Status: DC

## 2021-05-06 NOTE — Telephone Encounter (Signed)
Please call [phone] to answer medication questions

## 2021-05-06 NOTE — Telephone Encounter (Signed)
The pharmacy was clarifying  the medication for the Himira pen

## 2021-05-06 NOTE — Telephone Encounter (Signed)
Sent maintenance  does to the pharmacy

## 2021-05-14 ENCOUNTER — Encounter: Payer: Self-pay | Admitting: Gastroenterology

## 2021-05-14 ENCOUNTER — Other Ambulatory Visit: Payer: Self-pay

## 2021-05-14 ENCOUNTER — Ambulatory Visit (INDEPENDENT_AMBULATORY_CARE_PROVIDER_SITE_OTHER): Payer: 59 | Admitting: Gastroenterology

## 2021-05-14 VITALS — BP 152/84 | HR 75 | Temp 98.3°F | Ht 63.0 in | Wt 207.5 lb

## 2021-05-14 DIAGNOSIS — K5150 Left sided colitis without complications: Secondary | ICD-10-CM

## 2021-05-14 NOTE — Progress Notes (Signed)
[name], MD [address]  [place]  [place], [place] [address]  Main: [phone]  Fax: [phone]    Gastroenterology Consultation  Referring Provider:     Ranae Plumber, [place] Primary Care Physician:  Ranae Plumber, [place] Primary Gastroenterologist:  Dr. Lucilla Lame Reason for Consultation:     Left-sided colitis, ulcerative colitis        HPI:   Victoria Chan is a 55 y.o. female referred by Dr. Ranae Plumber, [place]  for consultation & management of left-sided colitis.  Patient reports approximately 5 years history of relapsing and remitting symptoms of left lower abdominal pain associated with pinkish mucus mixed with soft mushy stools.  She has lost about 20 pounds within last few years and her weight continues to downtrend.  She reports good appetite, patient is recently evaluated by Dr. 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  Background chronicity was not mentioned.  Patient is therefore referred to me for further management Her most recent labs from 08/2019 revealed normal hemoglobin, normal CMP She smokes cigarettes 1 pack/day  Follow-up visit 05/08/2020 Patient responded very well to prednisone course.  She is currently on 10 mg once a day for past 5 days.  She reports her abdominal pain and diarrhea, rectal bleeding have resolved.  She states that she does not recollect when it was last time that she had pain.  She did not tolerate prednisone well, she felt irritable, nervousness, lack of sleep, increased appetite.  She has significantly elevated fecal calprotectin levels 790  Follow-up visit 10/02/2020 Patient reports doing well off prednisone and currently on Lialda 1.2 g 4 tablets 2 times daily.  The prescription for Lialda was mistakenly sent for 8 tablets a day.  I have discussed with patient and clarified that she is supposed to be taking only 4  tablets daily and she understands it.  She is currently receiving Lialda prescription from [facility] clinic pharmacy. She reports that she had mild left lower quadrant discomfort few months ago for which she took prednisone for about 3 to 5 days.  She reports having 1-2 formed bowel movements daily, denies abdominal pain, blood and mucus in stool.  Her weight is stable.  She continues to smoke cigarettes, currently 1 pack/day.  She tried for [facility] health charity care, did not qualify for it.  Patient does not have any other concerns today  Follow-up visit 04/09/2021 Patient is here for follow-up of her ulcerative colitis.  She reports ongoing abdominal pain, predominantly in the left side.  She currently has insurance and she thinks Lialda has not been helping much.  She does report loose stools without any blood.  She is compliant with Lialda 2.4 g twice daily she is gaining weight.  She continues to smoke cigarettes.  Follow-up visit 05/14/2021 Patient's Humira has been approved.  She received message from her pharmacy that medication is ready.  She has to call the pharmacy to confirm the delivery of medication.  She continues to take prednisone, decreased to 20 mg last week.  She continues to feel better from GI standpoint.  However, she does have mood swings on prednisone as well as gaining weight.  She has disturbed sleep as well.  NSAIDs: Unknown  Antiplts/Anticoagulants/Anti thrombotics: None Patient denies family history of IBD, GI malignancy  GI Procedures: Colonoscopy 03/20/2020 - The examined portion of the ileum was normal. Biopsied. - Diffuse severe inflammation  was found at 40 cm proximal to the anus secondary to proctosigmoid colitis. Biopsied. - Left and right sised biopsies were obtained.  DIAGNOSIS:  A. TERMINAL ILEUM; COLD BIOPSY:  - ENTERIC MUCOSA WITH NO SIGNIFICANT PATHOLOGIC ALTERATION.  - NEGATIVE FOR INFLAMMATION, DYSPLASIA, AND MALIGNANCY.   B. COLON, RIGHT; COLD BIOPSY:   - COLONIC MUCOSA WITH NO SIGNIFICANT PATHOLOGIC ALTERATION.  - NEGATIVE FOR MICROSCOPIC COLITIS, DYSPLASIA, AND MALIGNANCY.   C. COLON, RECTOSIGMOID; COLD BIOPSY:  - MODERATE ACTIVE PROCTOCOLITIS WITH ULCER EXUDATE.  - SEE COMMENT.   Comment:  Diagnostic considerations include acute self-limited / infectious  colitis, ischemia, medication effects, and inflammatory bowel disease.  Clinical correlation is recommended.  There is no evidence of dysplasia  or malignancy.   Past Medical History:  Diagnosis Date   Loss of weight    Murmur     Past Surgical History:  Procedure Laterality Date   COLONOSCOPY WITH PROPOFOL N/A 03/20/2020   Procedure: COLONOSCOPY WITH PROPOFOL;  Surgeon: Lucilla Lame, MD;  Location: [facility] ENDOSCOPY;  Service: Endoscopy;  Laterality: N/A;   TUBAL LIGATION      Current Outpatient Medications:    mesalamine (LIALDA) 1.2 g EC tablet, Take 4 tablets (4.8 g total) by mouth 2 (two) times daily., Disp: 240 tablet, Rfl: 3   predniSONE (DELTASONE) 10 MG tablet, Take 4 tabs once daily for 5 days, then decrease by 1 tab every 5 days until finished, Disp: 100 tablet, Rfl: 0   Adalimumab (HUMIRA PEN) 40 MG/0.4ML PNKT, Inject 40 mg into the skin every 14 (fourteen) days. (Patient not taking: Reported on 05/14/2021), Disp: 6 each, Rfl: 3    Family History  Problem Relation Age of Onset   Heart disease Father      Social History   Tobacco Use   Smoking status: Every Day    Pack years: 0.00   Smokeless tobacco: Never  Substance Use Topics   Alcohol use: Not Currently   Drug use: No    Allergies as of 05/14/2021   (No Known Allergies)    Review of Systems:    All systems reviewed and negative except where noted in HPI.   Physical Exam:  BP (!) 152/84 (BP Location: Left Arm, Patient Position: Sitting, Cuff Size: Normal)   Pulse 75   Temp 98.3 F (36.8 C) (Oral)   Ht 5' 3"  (1.6 m)   Wt 207 lb 8 oz (94.1 kg)   LMP 04/24/2016 Comment: neg preg test  BMI  36.76 kg/m  Patient's last menstrual period was 04/24/2016.  General:   Alert,  Well-developed, well-nourished, pleasant and cooperative in NAD Head:  Normocephalic and atraumatic. Eyes:  Sclera clear, no icterus.   Conjunctiva pink. Ears:  Normal auditory acuity. Nose:  No deformity, discharge, or lesions. Mouth:  No deformity or lesions,oropharynx pink & moist. Neck:  Supple; no masses or thyromegaly. Lungs:  Respirations even and unlabored.  Clear throughout to auscultation.   No wheezes, crackles, or rhonchi. No acute distress. Heart:  Regular rate and rhythm; no murmurs, clicks, rubs, or gallops. Abdomen:  Normal bowel sounds. Soft, mild left lower quadrant tenderness and non-distended without masses, hepatosplenomegaly or hernias noted.  No guarding or rebound tenderness.   Rectal: Not performed Msk:  Symmetrical without gross deformities. Good, equal movement & strength bilaterally. Pulses:  Normal pulses noted. Extremities:  No clubbing or edema.  No cyanosis. Neurologic:  Alert and oriented x3;  grossly normal neurologically. Skin:  Intact without significant lesions or rashes. No  jaundice. Psych:  Alert and cooperative. Normal mood and affect.  Imaging Studies: Reviewed  Assessment and Plan:   Victoria Chan is a 55 y.o. Caucasian female with chronic tobacco use is seen for follow-up of moderate to severe left-sided colitis. 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  There was no evidence of chronicity.  Stool studies were negative for infection, normal CRP levels.  Patient has elevated fecal calprotectin levels.  She responded very well to prednisone, resulted in clinical remission.  Therefore, I will treat her condition as left-sided ulcerative colitis  Left-sided ulcerative colitis: Moderate to severe, uncontrolled on maximal dose of mesalamine therapy, repeat stool studies are negative for infection.  Fecal calprotectin levels are elevated Steroid  responsive, in the past, S/p 20 days course of prednisone Currently on prednisone 20 mg daily Discussed with patient about steroid sparing therapy including anti-TNF such as Humira, Remicade as well as anti-Integrins such as Entyvio, including risks and benefits.  Patient decided to try Humira as a first-line therapy biologic. Patient is on [place] since 04/2020,  She will continue 4.8 g daily for now Humira has been approved, she will start the medication once it is delivered and will decrease prednisone to 10 mg daily for 1 week, then stop Recommend repeat colonoscopy in about 6 months to assess response to Humira  Quantiferon Gold indeterminate,  repeat came back negative on 04/09/2021 of prednisone.  Acute viral hepatitis panel negative Discussed about abstinence from smoking  Follow up in 3 months   [name], MD

## 2021-05-31 ENCOUNTER — Telehealth: Payer: Self-pay | Admitting: Gastroenterology

## 2021-05-31 NOTE — Telephone Encounter (Signed)
Advised patient to watch her symptoms till her next does and let us know how she is feeling if they get any better or worse. Also advised patient to let us know if she develope any new symptoms

## 2021-05-31 NOTE — Telephone Encounter (Signed)
She took the second does on Wednesday dizzy, foggy headed, fatigue, pinch pain in chest that is a 4 out of 10, states it happen 4 or 5 times but not any more. The injection site is fine no reaction there. She states she had some of the same symptoms with the first injection but did not think anything about it

## 2021-05-31 NOTE — Telephone Encounter (Signed)
Adalimumab (HUMIRA PEN) 40 MG/0.4ML PNKT. Patient called and is having possible reaction. She is feeling dizzy, foggy and nerve pain in chest (0-10 @ 4) and feeling really fatigued. Please  [phone]

## 2021-06-03 NOTE — Telephone Encounter (Signed)
Next dose will be a smaller dose in 2weeks. Let's see how she feels. If still not tolerating we have to think about entyvio  RV

## 2021-06-03 NOTE — Telephone Encounter (Signed)
Tried to call patient but number kept ringing  sent a mychart message

## 2021-08-14 ENCOUNTER — Other Ambulatory Visit: Payer: Self-pay

## 2021-08-14 ENCOUNTER — Encounter: Payer: Self-pay | Admitting: Gastroenterology

## 2021-08-14 ENCOUNTER — Ambulatory Visit: Payer: 59 | Admitting: Gastroenterology

## 2021-08-14 VITALS — BP 137/75 | HR 81 | Temp 97.9°F | Ht 63.0 in | Wt 213.0 lb

## 2021-08-14 DIAGNOSIS — R002 Palpitations: Secondary | ICD-10-CM

## 2021-08-14 DIAGNOSIS — K5150 Left sided colitis without complications: Secondary | ICD-10-CM

## 2021-08-14 MED ORDER — SHINGRIX 50 MCG/0.5ML IM SUSR: 0.5000 mL | Freq: Once | INTRAMUSCULAR | 0 refills | Status: AC

## 2021-08-14 NOTE — Patient Instructions (Signed)
Please come back for a lab only appointment to check Humira levels

## 2021-08-14 NOTE — Progress Notes (Signed)
[name], MD [address]  [place]  [place], [place] [address]  Main: [phone]  Fax: [phone]    Gastroenterology Consultation  Referring Provider:     Ranae Plumber, [place] Primary Care Physician:  Ranae Plumber, [place] Primary Gastroenterologist:  Dr. Lucilla Lame Reason for Consultation:     Left-sided ulcerative colitis        HPI:   Victoria Chan is a 55 y.o. female referred by Dr. Ranae Plumber, [place]  for consultation & management of left-sided colitis.  Patient reports approximately 5 years history of relapsing and remitting symptoms of left lower abdominal pain associated with pinkish mucus mixed with soft mushy stools.  She has lost about 20 pounds within last few years and her weight continues to downtrend.  She reports good appetite, patient is recently evaluated by Dr. 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  Background chronicity was not mentioned.  Patient is therefore referred to me for further management Her most recent labs from 08/2019 revealed normal hemoglobin, normal CMP She smokes cigarettes 1 pack/day  Follow-up visit 05/08/2020 Patient responded very well to prednisone course.  She is currently on 10 mg once a day for past 5 days.  She reports her abdominal pain and diarrhea, rectal bleeding have resolved.  She states that she does not recollect when it was last time that she had pain.  She did not tolerate prednisone well, she felt irritable, nervousness, lack of sleep, increased appetite.  She has significantly elevated fecal calprotectin levels 790  Follow-up visit 10/02/2020 Patient reports doing well off prednisone and currently on Lialda 1.2 g 4 tablets 2 times daily.  The prescription for Lialda was mistakenly sent for 8 tablets a day.  I have discussed with patient and clarified that she is supposed to be taking only 4 tablets  daily and she understands it.  She is currently receiving Lialda prescription from [facility] pharmacy. She reports that she had mild left lower quadrant discomfort few months ago for which she took prednisone for about 3 to 5 days.  She reports having 1-2 formed bowel movements daily, denies abdominal pain, blood and mucus in stool.  Her weight is stable.  She continues to smoke cigarettes, currently 1 pack/day.  She tried for [facility] health charity care, did not qualify for it.  Patient does not have any other concerns today  Follow-up visit 04/09/2021 Patient is here for follow-up of her ulcerative colitis.  She reports ongoing abdominal pain, predominantly in the left side.  She currently has insurance and she thinks Lialda has not been helping much.  She does report loose stools without any blood.  She is compliant with Lialda 2.4 g twice daily she is gaining weight.  She continues to smoke cigarettes.  Follow-up visit 05/14/2021 Patient's Humira has been approved.  She received message from her pharmacy that medication is ready.  She has to call the pharmacy to confirm the delivery of medication.  She continues to take prednisone, decreased to 20 mg last week.  She continues to feel better from GI standpoint.  However, she does have mood swings on prednisone as well as gaining weight.  She has disturbed sleep as well.  Follow-up visit 08/14/2021 Patient is here for follow-up of ulcerative colitis.  Patient reports that her GI symptoms have resolved on Humira.  She started Humira in early July 2022.  She has noticed  symptoms of feeling dizzy, dull headache, leg pain.  She also noticed intermittent palpitations at bedtime, about once a week.  She is not sure if the symptoms have occurred closer to the Humira injection but the symptoms have drawn her attention wanted to know if these are secondary to Humira.  She continues to smoke tobacco.  Patient is gaining weight and she acknowledges that her meal  portion has increased in size.  TSH in 7/22 normal.  She also reports pain in her legs at rest.  NSAIDs: Unknown  Antiplts/Anticoagulants/Anti thrombotics: None Patient denies family history of IBD, GI malignancy  GI Procedures: Colonoscopy 03/20/2020 - The examined portion of the ileum was normal. Biopsied. - Diffuse severe inflammation was found at 40 cm proximal to the anus secondary to proctosigmoid colitis. Biopsied. - Left and right sised biopsies were obtained.  DIAGNOSIS:  A. TERMINAL ILEUM; COLD BIOPSY:  - ENTERIC MUCOSA WITH NO SIGNIFICANT PATHOLOGIC ALTERATION.  - NEGATIVE FOR INFLAMMATION, DYSPLASIA, AND MALIGNANCY.   B. COLON, RIGHT; COLD BIOPSY:  - COLONIC MUCOSA WITH NO SIGNIFICANT PATHOLOGIC ALTERATION.  - NEGATIVE FOR MICROSCOPIC COLITIS, DYSPLASIA, AND MALIGNANCY.   C. COLON, RECTOSIGMOID; COLD BIOPSY:  - MODERATE ACTIVE PROCTOCOLITIS WITH ULCER EXUDATE.  - SEE COMMENT.   Comment:  Diagnostic considerations include acute self-limited / infectious  colitis, ischemia, medication effects, and inflammatory bowel disease.  Clinical correlation is recommended.  There is no evidence of dysplasia  or malignancy.   Past Medical History:  Diagnosis Date   Loss of weight    Murmur     Past Surgical History:  Procedure Laterality Date   COLONOSCOPY WITH PROPOFOL N/A 03/20/2020   Procedure: COLONOSCOPY WITH PROPOFOL;  Surgeon: Lucilla Lame, MD;  Location[facility] ENDOSCOPY;  Service: Endoscopy;  Laterality: N/A;   TUBAL LIGATION      Current Outpatient Medications:    Adalimumab (HUMIRA PEN) 40 MG/0.4ML PNKT, Inject 40 mg into the skin every 14 (fourteen) days., Disp: 6 each, Rfl: 3   Zoster Vaccine Adjuvanted (SHINGRIX) injection, Inject 0.5 mLs into the muscle once for 1 dose., Disp: 0.5 mL, Rfl: 0    Family History  Problem Relation Age of Onset   Heart disease Father      Social History   Tobacco Use   Smoking status: Every Day   Smokeless tobacco:  Never  Substance Use Topics   Alcohol use: Not Currently   Drug use: No    Allergies as of 08/14/2021   (No Known Allergies)    Review of Systems:    All systems reviewed and negative except where noted in HPI.   Physical Exam:  BP 137/75 (BP Location: Right Arm, Patient Position: Sitting, Cuff Size: Normal)   Pulse 81   Temp 97.9 F (36.6 C) (Oral)   Ht 5' 3"  (1.6 m)   Wt 213 lb (96.6 kg)   LMP 04/24/2016 Comment: neg preg test  BMI 37.73 kg/m  Patient's last menstrual period was 04/24/2016.  General:   Alert,  Well-developed, well-nourished, pleasant and cooperative in NAD Head:  Normocephalic and atraumatic. Eyes:  Sclera clear, no icterus.   Conjunctiva pink. Ears:  Normal auditory acuity. Nose:  No deformity, discharge, or lesions. Mouth:  No deformity or lesions,oropharynx pink & moist. Neck:  Supple; no masses or thyromegaly. Lungs:  Respirations even and unlabored.  Clear throughout to auscultation.   No wheezes, crackles, or rhonchi. No acute distress. Heart:  Regular rate and rhythm; no murmurs, clicks, rubs, or gallops. Abdomen:  Normal bowel sounds. Soft, nontender and non-distended without masses, hepatosplenomegaly or hernias noted.  No guarding or rebound tenderness.   Rectal: Not performed Msk:  Symmetrical without gross deformities. Good, equal movement & strength bilaterally. Pulses:  Normal pulses noted. Extremities:  No clubbing or edema.  No cyanosis. Neurologic:  Alert and oriented x3;  grossly normal neurologically. Skin:  Intact without significant lesions or rashes. No jaundice. Psych:  Alert and cooperative. Normal mood and affect.  Imaging Studies: Reviewed  Assessment and Plan:   Yarimar Lavis Terpening is a 55 y.o. Caucasian female with chronic tobacco use is seen for follow-up of moderate to severe left-sided colitis. 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  There was no evidence of chronicity.  Stool studies were negative  for infection, normal CRP levels.  Patient has elevated fecal calprotectin levels.  She responded very well to prednisone, resulted in clinical remission.  Therefore, I will treat her condition as left-sided ulcerative colitis  Left-sided ulcerative colitis: Moderate to severe, uncontrolled on maximal dose of mesalamine therapy, repeat stool studies are negative for infection.  Fecal calprotectin levels are elevated Steroid responsive, in the past, S/p 20 days course of prednisone And either prednisone course in 6/22 Currently off Lialda Started on Humira monotherapy early July 2022, on biweekly dosing.  Patient has noticed some symptoms such as dull headache, intermittent palpitations at rest, bilateral leg pain since since initiation of Humira.  These symptoms are intermittent.  Will check for immunogenicity, check Humira antibodies and drug levels.  Will discontinue Humira if these symptoms are persistent upon continuation of medication.  Advised patient to maintain a log of the symptoms with timing of Humira dose Check CBC, CMP today and every 3 months, fecal calprotectin levels  Intermittent palpitations, bilateral leg pain at rest Will refer to cardiology given her history of chronic tobacco use  Quantiferon Gold indeterminate,  repeat came back negative on 04/09/2021 off prednisone.  Acute viral hepatitis panel negative Discussed about abstinence from smoking I have also discussed regarding hepatitis vaccine, pneumonia vaccine and Shingrix vaccine.  Patient is hesitant to receive pneumonia vaccine and hepatitis vaccine.  She said she will think about it and discuss with PCP.  Prescription for Shingrix vaccine provided today Discussed about health maintenance, she has annual physical scheduled with her PCP for mammogram, Pap smear  Follow up in 4 months or contact via MyChart as needed   [name], MD

## 2021-08-15 ENCOUNTER — Encounter: Payer: Self-pay | Admitting: Gastroenterology

## 2021-08-15 DIAGNOSIS — R739 Hyperglycemia, unspecified: Secondary | ICD-10-CM

## 2021-08-15 LAB — COMPREHENSIVE METABOLIC PANEL
ALT: 9 IU/L (ref 0–32)
AST: 12 IU/L (ref 0–40)
Albumin/Globulin Ratio: 1.7 (ref 1.2–2.2)
Albumin: 4.3 g/dL (ref 3.8–4.9)
Alkaline Phosphatase: 52 IU/L (ref 44–121)
BUN/Creatinine Ratio: 19 (ref 9–23)
BUN: 15 mg/dL (ref 6–24)
Bilirubin Total: <0.2 mg/dL (ref 0.0–1.2)
CO2: 24 mmol/L (ref 20–29)
Calcium: 9.5 mg/dL (ref 8.7–10.2)
Chloride: 103 mmol/L (ref 96–106)
Creatinine, Ser: 0.77 mg/dL (ref 0.57–1.00)
Globulin, Total: 2.6 g/dL (ref 1.5–4.5)
Glucose: 126 mg/dL — ABNORMAL HIGH (ref 65–99)
Potassium: 4.7 mmol/L (ref 3.5–5.2)
Sodium: 142 mmol/L (ref 134–144)
Total Protein: 6.9 g/dL (ref 6.0–8.5)
eGFR: 92 mL/min/{1.73_m2} (ref >59)

## 2021-08-15 LAB — CBC
Hematocrit: 40.4 % (ref 34.0–46.6)
Hemoglobin: 13.8 g/dL (ref 11.1–15.9)
MCH: 30.2 pg (ref 26.6–33.0)
MCHC: 34.2 g/dL (ref 31.5–35.7)
MCV: 88 fL (ref 79–97)
Platelets: 266 10*3/uL (ref 150–450)
RBC: 4.57 x10E6/uL (ref 3.77–5.28)
RDW: 12.3 % (ref 11.7–15.4)
WBC: 8.1 10*3/uL (ref 3.4–10.8)

## 2021-08-20 ENCOUNTER — Other Ambulatory Visit: Payer: Self-pay | Admitting: Gastroenterology

## 2021-08-20 NOTE — Telephone Encounter (Signed)
Order lab work

## 2021-08-26 LAB — SERIAL MONITORING

## 2021-08-28 LAB — CALPROTECTIN, FECAL: Calprotectin, Fecal: 48 ug/g (ref 0–120)

## 2021-08-30 LAB — ADALIMUMAB+AB (SERIAL MONITOR)
Adalimumab Drug Level: 4.8 ug/mL
Anti-Adalimumab Antibody: <25 ng/mL

## 2021-09-16 ENCOUNTER — Ambulatory Visit (INDEPENDENT_AMBULATORY_CARE_PROVIDER_SITE_OTHER): Payer: 59 | Admitting: Cardiology

## 2021-09-16 ENCOUNTER — Other Ambulatory Visit: Payer: Self-pay

## 2021-09-16 ENCOUNTER — Ambulatory Visit (INDEPENDENT_AMBULATORY_CARE_PROVIDER_SITE_OTHER): Payer: 59

## 2021-09-16 ENCOUNTER — Encounter: Payer: Self-pay | Admitting: Cardiology

## 2021-09-16 VITALS — BP 126/68 | HR 75 | Ht 63.0 in | Wt 218.0 lb

## 2021-09-16 DIAGNOSIS — R011 Cardiac murmur, unspecified: Secondary | ICD-10-CM

## 2021-09-16 DIAGNOSIS — R002 Palpitations: Secondary | ICD-10-CM

## 2021-09-16 DIAGNOSIS — F17200 Nicotine dependence, unspecified, uncomplicated: Secondary | ICD-10-CM

## 2021-09-16 NOTE — Patient Instructions (Signed)
Medication Instructions:   Your physician recommends that you continue on your current medications as directed. Please refer to the Current Medication list given to you today.  *If you need a refill on your cardiac medications before your next appointment, please call your pharmacy*   Lab Work:  None Order   Testing/Procedures:   Your physician has requested that you have an echocardiogram. Echocardiography is a painless test that uses sound waves to create images of your heart. It provides your doctor with information about the size and shape of your heart and how well your heart's chambers and valves are working. This procedure takes approximately one hour. There are no restrictions for this procedure.  Your physician has recommended that you wear a Zio XT monitor for 2 weeks.  This monitor is a medical device that records the heart's electrical activity. Doctors most often use these monitors to diagnose arrhythmias. Arrhythmias are problems with the speed or rhythm of the heartbeat. The monitor is a small device applied to your chest. You can wear one while you do your normal daily activities. While wearing this monitor if you have any symptoms to push the button and record what you felt. Once you have worn this monitor for the period of time provider prescribed (Usually 14 days), you will return the monitor device in the postage paid box. Once it is returned they will download the data collected and provide [place] with a report which the provider will then review and we will call you with those results. Important tips:  Avoid showering during the first 24 hours of wearing the monitor. Avoid excessive sweating to help maximize wear time. Do not submerge the device, no hot tubs, and no swimming pools. Keep any lotions or oils away from the patch. After 24 hours you may shower with the patch on. Take brief showers with your back facing the shower head.  Do not remove patch once it has been  placed because that will interrupt data and decrease adhesive wear time. Push the button when you have any symptoms and write down what you were feeling. Once you have completed wearing your monitor, remove and place into box which has postage paid and place in your outgoing mailbox.  If for some reason you have misplaced your box then call our office and we can provide another box and/or mail it off for you.    Follow-Up: At [facility], you and your health needs are our priority.  As part of our continuing mission to provide you with exceptional heart care, we have created designated Provider Care Teams.  These Care Teams include your primary Cardiologist (physician) and Advanced Practice Providers (APPs -  Physician Assistants and Nurse Practitioners) who all work together to provide you with the care you need, when you need it.  We recommend signing up for the patient portal called "MyChart".  Sign up information is provided on this After Visit Summary.  MyChart is used to connect with patients for Virtual Visits (Telemedicine).  Patients are able to view lab/test results, encounter notes, upcoming appointments, etc.  Non-urgent messages can be sent to your provider as well.   To learn more about what you can do with MyChart, go to [email or web address].    Your next appointment:   6 week(s)  The format for your next appointment:   In Person  Provider:   You may see Dr. Garen Lah or one of the following Advanced Practice Providers on your designated Care Team:  Murray Hodgkins, NP Christell Faith, PA-C Marrianne Mood, PA-C Cadence Kathlen Mody, [place]   Other Instructions
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
## 2021-09-17 ENCOUNTER — Ambulatory Visit: Payer: 59 | Admitting: Cardiology

## 2021-10-24 ENCOUNTER — Other Ambulatory Visit: Payer: 59

## 2021-10-31 ENCOUNTER — Other Ambulatory Visit: Payer: Self-pay | Admitting: Family Medicine

## 2021-10-31 DIAGNOSIS — Z1231 Encounter for screening mammogram for malignant neoplasm of breast: Secondary | ICD-10-CM

## 2021-11-01 ENCOUNTER — Ambulatory Visit: Payer: 59 | Admitting: Cardiology

## 2021-12-12 ENCOUNTER — Telehealth: Payer: Self-pay

## 2021-12-12 MED ORDER — HUMIRA (2 PEN) 40 MG/0.4ML ~~LOC~~ AJKT: 40.0000 mg | AUTO-INJECTOR | SUBCUTANEOUS | 3 refills | Status: DC

## 2021-12-12 NOTE — Telephone Encounter (Signed)
Patient has switch insurances and she states she now has to get Humira through [facility]. Sent Humira to them

## 2021-12-16 NOTE — Telephone Encounter (Signed)
Did PA on Humira and they approved medication form 12/15/21 to 12/15/22.

## 2021-12-25 ENCOUNTER — Telehealth: Payer: Self-pay

## 2021-12-25 MED ORDER — HUMIRA (2 PEN) 40 MG/0.4ML ~~LOC~~ AJKT: 40.0000 mg | AUTO-INJECTOR | SUBCUTANEOUS | 3 refills | Status: DC

## 2021-12-25 NOTE — Telephone Encounter (Signed)
[facility] is calling because patient can not longer get the Humira from her current pharmacy and has to use [facility]. Sent medication to [facility] they also need last office visit notes fax to them for the PA that they will complete

## 2022-01-08 ENCOUNTER — Telehealth: Payer: Self-pay

## 2022-01-08 NOTE — Telephone Encounter (Signed)
Patient states she has a cold and she has cough, running nose and chest congestion. She wants to know if she should take her Humira today

## 2022-01-08 NOTE — Telephone Encounter (Signed)
She can wait for 2 days and then take if she feels better  RV

## 2022-01-08 NOTE — Telephone Encounter (Signed)
Patient verbalized understanding of results  

## 2022-01-20 ENCOUNTER — Other Ambulatory Visit: Payer: Self-pay

## 2022-01-20 ENCOUNTER — Encounter: Payer: Self-pay | Admitting: Gastroenterology

## 2022-01-20 ENCOUNTER — Ambulatory Visit (INDEPENDENT_AMBULATORY_CARE_PROVIDER_SITE_OTHER): Payer: 59 | Admitting: Gastroenterology

## 2022-01-20 VITALS — BP 164/81 | HR 71 | Temp 97.9°F | Ht 63.0 in | Wt 224.4 lb

## 2022-01-20 DIAGNOSIS — R002 Palpitations: Secondary | ICD-10-CM | POA: Diagnosis not present

## 2022-01-20 DIAGNOSIS — K439 Ventral hernia without obstruction or gangrene: Secondary | ICD-10-CM | POA: Diagnosis not present

## 2022-01-20 DIAGNOSIS — K5150 Left sided colitis without complications: Secondary | ICD-10-CM | POA: Diagnosis not present

## 2022-01-20 NOTE — Progress Notes (Signed)
[name], MD [address]  [place], [place] [address]  Main: [phone]  Fax: [phone]    Gastroenterology Consultation  Referring Provider:     Ranae Plumber, [place] Primary Care Physician:  Ranae Plumber, [place] Primary Gastroenterologist:  Dr. Lucilla Lame Reason for Consultation:     Left-sided ulcerative colitis        HPI:   Victoria Chan is a 56 y.o. female referred by Dr. Ranae Plumber, [place]  for consultation & management of left-sided colitis.  Patient reports approximately 5 years history of relapsing and remitting symptoms of left lower abdominal pain associated with pinkish mucus mixed with soft mushy stools.  She has lost about 20 pounds within last few years and her weight continues to downtrend.  She reports good appetite, patient is recently evaluated by Dr. 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  Background chronicity was not mentioned.  Patient is therefore referred to me for further management Her most recent labs from 08/2019 revealed normal hemoglobin, normal CMP She smokes cigarettes 1 pack/day  Follow-up visit 05/08/2020 Patient responded very well to prednisone course.  She is currently on 10 mg once a day for past 5 days.  She reports her abdominal pain and diarrhea, rectal bleeding have resolved.  She states that she does not recollect when it was last time that she had pain.  She did not tolerate prednisone well, she felt irritable, nervousness, lack of sleep, increased appetite.  She has significantly elevated fecal calprotectin levels 790  Follow-up visit 10/02/2020 Patient reports doing well off prednisone and currently on Lialda 1.2 g 4 tablets 2 times daily.  The prescription for Lialda was mistakenly sent for 8 tablets a day.  I have discussed with patient and clarified that she is supposed to be taking only 4 tablets  daily and she understands it.  She is currently receiving Lialda prescription from [facility] pharmacy. She reports that she had mild left lower quadrant discomfort few months ago for which she took prednisone for about 3 to 5 days.  She reports having 1-2 formed bowel movements daily, denies abdominal pain, blood and mucus in stool.  Her weight is stable.  She continues to smoke cigarettes, currently 1 pack/day.  She tried for [facility] health charity care, did not qualify for it.  Patient does not have any other concerns today  Follow-up visit 04/09/2021 Patient is here for follow-up of her ulcerative colitis.  She reports ongoing abdominal pain, predominantly in the left side.  She currently has insurance and she thinks Lialda has not been helping much.  She does report loose stools without any blood.  She is compliant with Lialda 2.4 g twice daily she is gaining weight.  She continues to smoke cigarettes.  Follow-up visit 05/14/2021 Patient's Humira has been approved.  She received message from her pharmacy that medication is ready.  She has to call the pharmacy to confirm the delivery of medication.  She continues to take prednisone, decreased to 20 mg last week.  She continues to feel better from GI standpoint.  However, she does have mood swings on prednisone as well as gaining weight.  She has disturbed sleep as well.  Follow-up visit 08/14/2021 Patient is here for follow-up of ulcerative colitis.  Patient reports that her GI symptoms have resolved on Humira.  She started Humira in early July 2022.  She has noticed  symptoms of feeling dizzy, dull headache, leg pain.  She also noticed intermittent palpitations at bedtime, about once a week.  She is not sure if the symptoms have occurred closer to the Humira injection but the symptoms have drawn her attention wanted to know if these are secondary to Humira.  She continues to smoke tobacco.  Patient is gaining weight and she acknowledges that her meal  portion has increased in size.  TSH in 7/22 normal.  She also reports pain in her legs at rest.  Follow-up visit 01/20/2022 Patient is here for follow-up of ulcerative colitis.  She is doing very well on Humira every other week, and has been tolerating well, denies any symptoms associated with it.  She denies any abdominal pain, diarrhea, rectal bleeding.  She is concerned about worsening of ventral hernia associated with discomfort, particularly after eating.  She states that she has ventral hernia for several years, lately it has been getting worse.  She has history of tobacco use, associated with chronic cough.  She has gained weight within last 6 months, trying to lose weight.  She has cut down on smoking to 15 cigarettes/day.  Patient has not received any vaccinations yet. Patient has seen [facility] health cardiology, Dr. Garen Lah received 2 week cardiac monitor as well as echocardiogram.  She found that the [facility] health cardiology is out of network, did not follow-up with them again.  Patient did not undergo echocardiography yet.  NSAIDs: Unknown  Antiplts/Anticoagulants/Anti thrombotics: None Patient denies family history of IBD, GI malignancy  GI Procedures: Colonoscopy 03/20/2020 - The examined portion of the ileum was normal. Biopsied. - Diffuse severe inflammation was found at 40 cm proximal to the anus secondary to proctosigmoid colitis. Biopsied. - Left and right sised biopsies were obtained.  DIAGNOSIS:  A. TERMINAL ILEUM; COLD BIOPSY:  - ENTERIC MUCOSA WITH NO SIGNIFICANT PATHOLOGIC ALTERATION.  - NEGATIVE FOR INFLAMMATION, DYSPLASIA, AND MALIGNANCY.   B. COLON, RIGHT; COLD BIOPSY:  - COLONIC MUCOSA WITH NO SIGNIFICANT PATHOLOGIC ALTERATION.  - NEGATIVE FOR MICROSCOPIC COLITIS, DYSPLASIA, AND MALIGNANCY.   C. COLON, RECTOSIGMOID; COLD BIOPSY:  - MODERATE ACTIVE PROCTOCOLITIS WITH ULCER EXUDATE.  - SEE COMMENT.   Comment:  Diagnostic considerations include acute self-limited  / infectious  colitis, ischemia, medication effects, and inflammatory bowel disease.  Clinical correlation is recommended.  There is no evidence of dysplasia  or malignancy.   Past Medical History:  Diagnosis Date   Loss of weight    Murmur     Past Surgical History:  Procedure Laterality Date   COLONOSCOPY WITH PROPOFOL N/A 03/20/2020   Procedure: COLONOSCOPY WITH PROPOFOL;  Surgeon: Lucilla Lame, MD;  Location[facility] ENDOSCOPY;  Service: Endoscopy;  Laterality: N/A;   TUBAL LIGATION      Current Outpatient Medications:    Adalimumab (HUMIRA PEN) 40 MG/0.4ML PNKT, Inject 40 mg into the skin every 14 (fourteen) days., Disp: 6 each, Rfl: 3    Family History  Problem Relation Age of Onset   Heart disease Father      Social History   Tobacco Use   Smoking status: Every Day   Smokeless tobacco: Never  Substance Use Topics   Alcohol use: Not Currently   Drug use: No    Allergies as of 01/20/2022   (No Known Allergies)    Review of Systems:    All systems reviewed and negative except where noted in HPI.   Physical Exam:  BP (!) 164/81 (BP Location: Left Arm, Patient Position:  Sitting, Cuff Size: Normal)    Pulse 71    Temp 97.9 F (36.6 C) (Oral)    Ht 5' 3"  (1.6 m)    Wt 224 lb 6 oz (101.8 kg)    LMP 04/24/2016 Comment: neg preg test   BMI 39.75 kg/m  Patient's last menstrual period was 04/24/2016.  General:   Alert,  Well-developed, well-nourished, pleasant and cooperative in NAD Head:  Normocephalic and atraumatic. Eyes:  Sclera clear, no icterus.   Conjunctiva pink. Ears:  Normal auditory acuity. Nose:  No deformity, discharge, or lesions. Mouth:  No deformity or lesions,oropharynx pink & moist. Neck:  Supple; no masses or thyromegaly. Lungs:  Respirations even and unlabored.  Clear throughout to auscultation.   No wheezes, crackles, or rhonchi. No acute distress. Heart:  Regular rate and rhythm; no murmurs, clicks, rubs, or gallops. Abdomen:  Normal bowel  sounds. Soft, obese, nontender, reducible ventral hernia without masses, hepatosplenomegaly or hernias noted.  No guarding or rebound tenderness.   Rectal: Not performed Msk:  Symmetrical without gross deformities. Good, equal movement & strength bilaterally. Pulses:  Normal pulses noted. Extremities:  No clubbing or edema.  No cyanosis. Neurologic:  Alert and oriented x3;  grossly normal neurologically. Skin:  Intact without significant lesions or rashes. No jaundice. Psych:  Alert and cooperative. Normal mood and affect.  Imaging Studies: Reviewed  Assessment and Plan:   Geneieve Duell Stofer is a 56 y.o. Caucasian female with chronic tobacco use is seen for follow-up of moderate to severe left-sided colitis. 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  There was no evidence of chronicity.  Stool studies were negative for infection, normal CRP levels.  Patient has elevated fecal calprotectin levels.  She responded very well to prednisone, resulted in clinical remission.  Therefore, I will treat her condition as left-sided ulcerative colitis  Left-sided ulcerative colitis: Currently in clinical remission Moderate to severe, uncontrolled on maximal dose of mesalamine therapy, repeat stool studies are negative for infection.  Fecal calprotectin levels are elevated Steroid responsive, in the past, S/p 20 days course of prednisone Repeat prednisone course in 6/22, Lialda has been discontinued Started on Humira monotherapy early July 2022, on biweekly dosing, tolerating well without any side effects Recommend follow-up colonoscopy in 04/2022 to assess response to Humira.   Check CBC, CMP today and every 3 months, Fecal calprotectin levels are undetectable in 07/2021  Ventral hernia Nontender and reducible, moderate size Reiterated to the patient regarding weight loss, wearing abdominal binder, cessation of smoking.  Advised patient to contact me for surgery referral if hernia is  growing or if she develops any pain  Intermittent palpitations, bilateral leg pain at rest Will refer to [facility] cardiology given her history of chronic tobacco use Patient was seen by [facility] health cardiologist, underwent cardiac monitoring for 2 weeks, reportedly normal Echocardiogram is pending  Quantiferon Gold indeterminate,  repeat came back negative on 04/09/2021 off prednisone.  Acute viral hepatitis panel negative Discussed about abstinence from smoking I have also discussed regarding hepatitis vaccine, pneumonia vaccine and Shingrix vaccine.  Patient is hesitant to receive pneumonia vaccine and hepatitis vaccine.  She said she will think about it and discuss with PCP.  Discussed about health maintenance, she has annual physical scheduled with her PCP for mammogram, Pap smear  Follow up in 6 months or contact via MyChart as needed   [name], MD

## 2022-01-21 LAB — COMPREHENSIVE METABOLIC PANEL
ALT: 13 IU/L (ref 0–32)
AST: 17 IU/L (ref 0–40)
Albumin/Globulin Ratio: 1.6 (ref 1.2–2.2)
Albumin: 4.4 g/dL (ref 3.8–4.9)
Alkaline Phosphatase: 47 IU/L (ref 44–121)
BUN/Creatinine Ratio: 12 (ref 9–23)
BUN: 10 mg/dL (ref 6–24)
Bilirubin Total: <0.2 mg/dL (ref 0.0–1.2)
CO2: 25 mmol/L (ref 20–29)
Calcium: 10 mg/dL (ref 8.7–10.2)
Chloride: 107 mmol/L — ABNORMAL HIGH (ref 96–106)
Creatinine, Ser: 0.86 mg/dL (ref 0.57–1.00)
Globulin, Total: 2.8 g/dL (ref 1.5–4.5)
Glucose: 92 mg/dL (ref 70–99)
Potassium: 5.3 mmol/L — ABNORMAL HIGH (ref 3.5–5.2)
Sodium: 146 mmol/L — ABNORMAL HIGH (ref 134–144)
Total Protein: 7.2 g/dL (ref 6.0–8.5)
eGFR: 81 mL/min/{1.73_m2} (ref >59)

## 2022-01-21 LAB — CBC
Hematocrit: 39.9 % (ref 34.0–46.6)
Hemoglobin: 13.8 g/dL (ref 11.1–15.9)
MCH: 30.8 pg (ref 26.6–33.0)
MCHC: 34.6 g/dL (ref 31.5–35.7)
MCV: 89 fL (ref 79–97)
Platelets: 256 10*3/uL (ref 150–450)
RBC: 4.48 x10E6/uL (ref 3.77–5.28)
RDW: 12.1 % (ref 11.7–15.4)
WBC: 9.5 10*3/uL (ref 3.4–10.8)

## 2022-01-22 ENCOUNTER — Encounter: Payer: Self-pay | Admitting: Gastroenterology

## 2022-01-22 DIAGNOSIS — K439 Ventral hernia without obstruction or gangrene: Secondary | ICD-10-CM

## 2022-01-28 ENCOUNTER — Ambulatory Visit: Payer: 59 | Admitting: Surgery

## 2022-01-28 ENCOUNTER — Encounter: Payer: Self-pay | Admitting: Surgery

## 2022-01-28 ENCOUNTER — Other Ambulatory Visit: Payer: Self-pay

## 2022-01-28 VITALS — BP 168/82 | HR 87 | Temp 98.4°F | Ht 63.5 in | Wt 222.8 lb

## 2022-01-28 DIAGNOSIS — M6208 Separation of muscle (nontraumatic), other site: Secondary | ICD-10-CM

## 2022-01-28 NOTE — Patient Instructions (Addendum)
If you have any concerns or questions, please feel free to call our office. Follow up as needed.  ? ? ?Diastasis Recti ?Diastasis recti is a condition in which the muscles of the abdomen (rectus abdominis muscles) become thin and separate. The result is a wider space between the muscles of the right and left abdomen (abdominal muscles). This wider space between the muscles may cause a bulge in the middle of the abdomen. This bulge may be noticed when a person is straining or when he or she sits up after lying down. ?Diastasis recti can affect men and women. It is most common among pregnant women, babies, people with obesity, and people who have had abdominal surgery. Exercise or surgery may help correct this condition. ?What are the causes? ?Common causes of this condition include: ?Pregnancy. As the uterus grows in size, it puts pressure on the abdominal muscles, causing the muscles to separate. ?Obesity. Excess fat puts pressure on abdominal muscles. ?Weight lifting. ?Some exercises of the abdomen. ?Advanced age. ?Genetics. ?Having had surgery on the abdomen before. ?What increases the risk? ?This condition is more likely to develop in: ?Women. ?Newborns, especially newborns who are born early (prematurely). ?What are the signs or symptoms? ?Common symptoms of this condition include: ?A bulge in the middle of your abdomen. You will notice it most when you sit up or strain. ?Pain in your low back, hips, or the area between your hip bones (pelvis). ?Constipation. ?Being unable to control when you urinate (urinary incontinence). ?Bloating. ?Poor posture. ?How is this diagnosed? ?This condition is diagnosed with a physical exam. During the exam, your health care provider will ask you to lie flat on your back and do a crunch or half sit-up. If you have diastasis recti, a bulge will appear lengthwise between your abdominal muscles in the center of your abdomen. Your health care provider will measure the gap between your  muscles with one of the following: ?A medical device used to measure the space between two objects (caliper). ?A tape measure. ?CT scan. ?Ultrasound. ?Finger spaces. Your health care provider will measure the space using his or her fingers. ?How is this treated? ?If your muscle separation is not too large, you may not need treatment. However, if you are a woman who plans to become pregnant again, you should treat this condition before your next pregnancy. Treatment may include: ?Physical therapy exercises to strengthen and tighten your abdominal muscles. ?Lifestyle changes such as weight loss and exercise. ?Over-the-counter pain medicines as needed. ?Surgery to correct the separation. ?Follow these instructions at home: ?Activity ?Return to your normal activities as told by your health care provider. Ask your health care provider what activities are safe for you. ?Do exercises as told by your health care provider. Make sure you are doing your exercises and movements correctly when lifting weights or doing exercises using your abdominal muscles or the muscles in the center of your body that give stability (core muscles). Proper form can help to prevent this condition from happening again. ?General instructions ?If you are overweight, ask your health care provider for help with weight loss. Losing even a small amount of weight can help to improve your diastasis recti. ?Take over-the-counter or prescription medicines only as told by your health care provider. ?Do not strain. Straining can make the separation worse. Examples of straining include: ?Pushing hard to have a bowel movement, such as when you have constipation. ?Lifting heavy objects or lifting children. ?Standing up and sitting down. ?You may  need to take these actions to prevent or treat constipation: ?Drink enough fluid to keep your urine pale yellow. ?Take over-the-counter or prescription medicines. ?Eat foods that are high in fiber, such as beans, whole  grains, and fresh fruits and vegetables. ?Limit foods that are high in fat and processed sugars, such as fried or sweet foods. ?Keep all follow-up visits. This is important. ?Contact a health care provider if: ?You notice a new bulge in your abdomen. ?Get help right away if: ?You experience severe discomfort in your abdomen. ?You develop severe abdominal pain along with nausea, vomiting, or a fever. ?Summary ?Diastasis recti is a condition in which the muscles of the abdomen (rectus abdominismuscles) become thin and separate. You may notice a bulge in your abdomen because the space has widened between the muscles of the right and left abdomen. ?The most common symptom is a bulge in the middle of your abdomen. You will notice it most when you sit up or strain. ?This condition is diagnosed with a physical exam. ?If the muscle separation is not too big, you may not need treatment. Otherwise, you may need to do physical therapy or have surgery. ?This information is not intended to replace advice given to you by your health care provider. Make sure you discuss any questions you have with your health care provider. ?Document Revised: 07/13/2020 Document Reviewed: 07/13/2020 ?Elsevier Patient Education ? 2022 [place]. ? ? ?

## 2022-01-28 NOTE — Progress Notes (Signed)
Patient ID: Victoria Chan, female   DOB: [birth year], 56 y.o.   MRN: [id number] ? ?Chief Complaint: Ventral hernia ? ?History of Present Illness ?Victoria Chan Lina is a 56 y.o. female with ventral hernia reportedly for 6 to 7 years.  She indicates her biggest degree of discomfort seems to occur after eating, she admits to an associated bloating.  She reports the bulge is growing in size, but denies pain.  She notes no history of nausea, vomiting, fevers or chills associated with this.  She has had no prior surgeries to the abdominal wall.  She indicates some discomfort with sitting up from a lying position, and notes some cramping with intentional tightening of her abdominal wall.  She has never had the bulge while at rest.  She reports being significantly heavier some years ago, had lost good amount of weight with her ulcerative colitis but has since regained 20 some pounds. ? ?Past Medical History ?Past Medical History:  ?Diagnosis Date  ? Loss of weight   ? Murmur   ?  ? ? ?Past Surgical History:  ?Procedure Laterality Date  ? COLONOSCOPY WITH PROPOFOL N/A 03/20/2020  ? Procedure: COLONOSCOPY WITH PROPOFOL;  Surgeon: Lucilla Lame, MD;  Location: [facility] ENDOSCOPY;  Service: Endoscopy;  Laterality: N/A;  ? TUBAL LIGATION    ? ? ?No Known Allergies ? ?Current Outpatient Medications  ?Medication Sig Dispense Refill  ? Adalimumab (HUMIRA PEN) 40 MG/0.4ML PNKT Inject 40 mg into the skin every 14 (fourteen) days. 6 each 3  ? ?No current facility-administered medications for this visit.  ? ? ?Family History ?Family History  ?Problem Relation Age of Onset  ? Heart disease Father   ?  ? ? ?Social History ?Social History  ? ?Tobacco Use  ? Smoking status: Every Day  ? Smokeless tobacco: Never  ?Substance Use Topics  ? Alcohol use: Not Currently  ? Drug use: No  ?  ?  ? ? ?Review of Systems  ?Constitutional: Negative.   ?HENT: Negative.    ?Eyes: Negative.   ?Respiratory:  Positive for cough.   ?Cardiovascular:  Positive for  palpitations.  ?Gastrointestinal:  Negative for abdominal pain.  ?Genitourinary: Negative.   ?Skin: Negative.   ?Neurological: Negative.   ?Psychiatric/Behavioral: Negative.    ?  ? ?Physical Exam ?Blood pressure (!) 168/82, pulse 87, temperature 98.4 ?F (36.9 ?C), temperature source Oral, height 5' 3.5" (1.613 m), weight 222 lb 12.8 oz (101.1 kg), last menstrual period 04/24/2016, SpO2 97 %. ?Last Weight  Most recent update: 01/28/2022  9:55 AM  ? ? Weight  ?101.1 kg (222 lb 12.8 oz)  ?      ? ?  ? ? ?CONSTITUTIONAL: Well developed, and nourished, appropriately responsive and aware without distress.   ?EYES: Sclera non-icteric.   ?EARS, NOSE, MOUTH AND THROAT: Mask worn.     Hearing is intact to voice.  ?NECK: Trachea is midline, and there is no jugular venous distension.  ?LYMPH NODES:  Lymph nodes in the neck are not enlarged. ?RESPIRATORY:  Lungs are clear, and breath sounds are equal bilaterally. Normal respiratory effort without pathologic use of accessory muscles. ?CARDIOVASCULAR: Heart is regular in rate and rhythm. ?GI: The abdomen is is notable for a diastases recti, without appreciable fascial defect.  Otherwise soft, nontender, and nondistended. There were no palpable masses. I did not appreciate hepatosplenomegaly.  ?MUSCULOSKELETAL:  Symmetrical muscle tone appreciated in all four extremities.    ?SKIN: Skin turgor is normal. No pathologic skin lesions appreciated.  ?  NEUROLOGIC:  Motor and sensation appear grossly normal.  Cranial nerves are grossly without defect. ?PSYCH:  Alert and oriented to person, place and time. Affect is appropriate for situation. ? ?Data Reviewed ?I have personally reviewed what is currently available of the patient's imaging, recent labs and medical records.   ?Labs:  ?CBC Latest Ref Rng & Units 01/20/2022 08/14/2021 04/09/2021  ?WBC 3.4 - 10.8 x10E3/uL 9.5 8.1 9.1  ?Hemoglobin 11.1 - 15.9 g/dL 13.8 13.8 14.1  ?Hematocrit 34.0 - 46.6 % 39.9 40.4 40.6  ?Platelets 150 - 450 x10E3/uL  256 266 265  ? ?CMP Latest Ref Rng & Units 01/20/2022 08/14/2021 04/09/2021  ?Glucose 70 - 99 mg/dL 92 126(H) 93  ?BUN 6 - 24 mg/dL 10 15 14   ?Creatinine 0.57 - 1.00 mg/dL 0.86 0.77 0.85  ?Sodium 134 - 144 mmol/L 146(H) 142 140  ?Potassium 3.5 - 5.2 mmol/L 5.3(H) 4.7 4.9  ?Chloride 96 - 106 mmol/L 107(H) 103 103  ?CO2 20 - 29 mmol/L 25 24 22   ?Calcium 8.7 - 10.2 mg/dL 10.0 9.5 9.7  ?Total Protein 6.0 - 8.5 g/dL 7.2 6.9 7.2  ?Total Bilirubin 0.0 - 1.2 mg/dL <0.2 <0.2 0.2  ?Alkaline Phos 44 - 121 IU/L 47 52 50  ?AST 0 - 40 IU/L 17 12 11   ?ALT 0 - 32 IU/L 13 9 11   ? ? ? ? ?Imaging: ? ?Within last 24 hrs: No results found. ? ?Assessment ?   ?Diastases recti ?Patient Active Problem List  ? Diagnosis Date Noted  ? Left sided colitis with rectal bleeding ([place]) 10/02/2020  ? ? ?Plan ?   ?We discussed the lack of a fascial defect, and the meaning of eventration associated with diastases.  We also discussed the soreness of the medial edges of the recti.  I believe weight loss may be helpful at the discomfort she is having, and may provide additional relief and potential for reapproximation should she desire to pursue this.  At present I do not believe it is warranted to pursue this from a surgically corrective perspective. ? ?Face-to-face time spent with the patient and accompanying care providers(if present) was 30 minutes, with more than 50% of the time spent counseling, educating, and coordinating care of the patient.   ? ?These notes generated with voice recognition software. I apologize for typographical errors. ? ?Ronny Bacon M.D., FACS ?01/28/2022, 10:22 AM ? ? ? ? ?

## 2022-02-04 ENCOUNTER — Encounter: Payer: Self-pay | Admitting: Gastroenterology

## 2022-02-07 DIAGNOSIS — R21 Rash and other nonspecific skin eruption: Secondary | ICD-10-CM | POA: Diagnosis not present

## 2022-02-12 DIAGNOSIS — L309 Dermatitis, unspecified: Secondary | ICD-10-CM | POA: Diagnosis not present

## 2022-02-17 ENCOUNTER — Encounter: Payer: Self-pay | Admitting: Gastroenterology

## 2022-02-18 ENCOUNTER — Telehealth: Payer: 59 | Admitting: Gastroenterology

## 2022-02-18 ENCOUNTER — Encounter: Payer: Self-pay | Admitting: Gastroenterology

## 2022-02-18 ENCOUNTER — Other Ambulatory Visit: Payer: Self-pay

## 2022-02-18 ENCOUNTER — Telehealth: Payer: Self-pay

## 2022-02-18 DIAGNOSIS — K5150 Left sided colitis without complications: Secondary | ICD-10-CM | POA: Diagnosis not present

## 2022-02-18 DIAGNOSIS — L403 Pustulosis palmaris et plantaris: Secondary | ICD-10-CM

## 2022-02-18 MED ORDER — NA SULFATE-K SULFATE-MG SULF 17.5-3.13-1.6 GM/177ML PO SOLN
354.0000 mL | Freq: Once | ORAL | 0 refills | Status: AC
Start: 2022-02-18 — End: 2022-02-18

## 2022-02-18 MED ORDER — MESALAMINE 1.2 G PO TBEC: 2.4000 g | DELAYED_RELEASE_TABLET | Freq: Two times a day (BID) | ORAL | 3 refills | Status: AC

## 2022-02-18 MED ORDER — FUROSEMIDE 20 MG PO TABS: 20.0000 mg | ORAL_TABLET | Freq: Every day | ORAL | 1 refills | Status: AC

## 2022-02-18 NOTE — Progress Notes (Signed)
?  ?Sherri Sear, MD ?[address]  ?[address]  ?[place], [place] [address]  ?Main: [phone]  ?Fax: [phone] ? ? ? ?Gastroenterology Consultation Video Visit ? ?Referring Provider:     Juluis Pitch, MD ?Primary Care Physician:  Juluis Pitch, MD ?Primary Gastroenterologist:  Dr. [name] ?Reason for Consultation:    Ulcerative colitis ?      ? HPI:   ?Victoria Chan is a 56 y.o. female referred by Dr. Juluis Pitch, MD  for consultation & management of ulcerative colitis ? ?Virtual Visit Video Note ? ?I connected with Victoria Chan on 02/18/22 at  2:45 PM EDT by video and verified that I am speaking with the correct person using two identifiers. ?  ?I discussed the limitations, risks, security and privacy concerns of performing an evaluation and management service by video and the availability of in person appointments. I also discussed with the patient that there may be a patient responsible charge related to this service. The patient expressed understanding and agreed to proceed. ? ?Location of the Patient: Home ? ?Location of the provider: Office ? ?Persons participating in the visit: Patient and provider only ? ? 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  Patient developed pustular rash on her palms and soles about 2 weeks ago when she reached out to me, initially she thought it was hand-foot and mouth disease.  I have advised her to hold Humira injection.  She saw Dr. Maudie Mercury at [facility] dermatologist who performed skin biopsy and diagnosed her with palmoplantar psoriasis.  She is started on topical steroid cream.  Her primary care provider also started her on short course of prednisone.  Patient has not restarted Humira since then.  She feels that the rash is slowly healing but it has significantly impacted her daily life.  She is doing well from colitis  standpoint.  She was also evaluated by general surgeon, Dr. Christian Mate for possible ventral hernia and she was told that she does not have ventral hernia, she has diastases recti. ?  ? ?NSAIDs: None ? ?Antiplts/Anticoagulants/Anti thrombotics: None ? ?GI Procedures:  ?Colonoscopy 03/20/2020 ?- The examined portion of the ileum was normal. Biopsied. ?- Diffuse severe inflammation was found at 40 cm proximal to the anus secondary to proctosigmoid colitis. ?Biopsied. ?- Left and right sised biopsies were obtained. ?  ?DIAGNOSIS:  ?A. TERMINAL ILEUM; COLD BIOPSY:  ?- ENTERIC MUCOSA WITH NO SIGNIFICANT PATHOLOGIC ALTERATION.  ?- NEGATIVE FOR INFLAMMATION, DYSPLASIA, AND MALIGNANCY.  ? ?B. COLON, RIGHT; COLD BIOPSY:  ?- COLONIC MUCOSA WITH NO SIGNIFICANT PATHOLOGIC ALTERATION.  ?- NEGATIVE FOR MICROSCOPIC COLITIS, DYSPLASIA, AND MALIGNANCY.  ? ?C. COLON, RECTOSIGMOID; COLD BIOPSY:  ?- MODERATE ACTIVE PROCTOCOLITIS WITH ULCER EXUDATE.  ?- SEE COMMENT.  ? ?Comment:  ?Diagnostic considerations include acute self-limited / infectious  ?colitis, ischemia, medication effects, and inflammatory bowel disease.  ?Clinical correlation is recommended.  There is no evidence of dysplasia  ?or malignancy.  ? ?Past Medical History:  ?Diagnosis Date  ? Loss of weight   ? Murmur   ? ? ?Past Surgical History:  ?Procedure Laterality Date  ? COLONOSCOPY WITH PROPOFOL N/A 03/20/2020  ? Procedure: COLONOSCOPY WITH PROPOFOL;  Surgeon: Lucilla Lame, MD;  Location: [facility] ENDOSCOPY;  Service: Endoscopy;  Laterality: N/A;  ? TUBAL LIGATION    ? ? ?Current Outpatient Medications:  ?  fluocinonide ointment (LIDEX) 0.05 %,  SMARTSIG:sparingly Topical Twice Daily PRN, Disp: , Rfl:  ?  furosemide (LASIX) 20 MG tablet, Take 1 tablet (20 mg total) by mouth daily., Disp: 7 tablet, Rfl: 1 ?  mesalamine (LIALDA) 1.2 g EC tablet, Take 2 tablets (2.4 g total) by mouth 2 (two) times daily., Disp: 120 tablet, Rfl: 3 ?  Na Sulfate-K Sulfate-Mg Sulf 17.5-3.13-1.6  GM/177ML SOLN, Take 354 mLs by mouth once for 1 dose., Disp: 354 mL, Rfl: 0 ?  predniSONE (DELTASONE) 20 MG tablet, Start 2 tab qd.  can reduce to 1 tab a day after 1 week., Disp: , Rfl:  ?  Adalimumab (HUMIRA PEN) 40 MG/0.4ML PNKT, Inject 40 mg into the skin every 14 (fourteen) days. (Patient not taking: Reported on 02/18/2022), Disp: 6 each, Rfl: 3 ? ? ? ?Family History  ?Problem Relation Age of Onset  ? Heart disease Father   ?  ? ?Social History  ? ?Tobacco Use  ? Smoking status: Every Day  ? Smokeless tobacco: Never  ?Substance Use Topics  ? Alcohol use: Not Currently  ? Drug use: No  ? ? ?Allergies as of 02/18/2022  ? (No Known Allergies)  ? ? ?Imaging Studies: ?Reviewed ? ?Assessment and Plan:  ? ?Victoria Chan is a 56 y.o. 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  Discontinue Humira ?I have discussed with patient regarding alternative long-term maintenance therapy such as ozanimod, vedolizumab.  Patient is reluctant and scared to start any new biologic therapy given palmoplantar psoriasis although I tried to reassure patient that palmoplantar psoriasis is reported only with anti-TNF agents.  She would like to go back on Lialda. ?We will restart Lialda 2.4 g twice daily ?Recommend colonoscopy in 04/2022 as previously planned to assess severity of her colitis.  If her symptoms recurred on Lialda, will discuss with her again regarding initiation of biologic therapy ?We will do short course of furosemide 20 mg daily for swelling of hands and feet secondary to prednisone ? ? ?Follow Up Instructions: ?  ?I discussed the assessment and treatment plan with the patient. The patient was provided an opportunity to ask questions and all were answered. The patient agreed with the plan and demonstrated an understanding of the instructions. ?  ?The patient was advised to call back or seek an in-person evaluation  if the symptoms worsen or if the condition fails to improve as anticipated. ? ?I provided 20 minutes of face-to-face time during this encounter. ? ? ?Follow up in 4 months ? ? ?[name], MD  ? ?

## 2022-02-18 NOTE — Telephone Encounter (Signed)
-----   Message from Lin Landsman, MD sent at 02/18/2022 10:39 AM EDT ----- ?Regarding: Re: Humira ?Caryl Pina ? 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  She will stop Humira and please notify the pharmacy.  Patient will let us know about alternate medication like Zeposia or Entyvio ? ?Also, please obtain a copy of skin biopsy result and office visit note from Dr. Maudie Mercury at [facility] dermatology ? ?[email or web address] ? ?Thanks ?Rohini Vanga ? ? ? ?

## 2022-02-18 NOTE — Telephone Encounter (Signed)
Patient will come by and sign medical release form to get path results. Patient would like to discuss the medications with you. Made mychart visit today at 2:45 ?

## 2022-02-24 DIAGNOSIS — R0602 Shortness of breath: Secondary | ICD-10-CM | POA: Diagnosis not present

## 2022-02-24 DIAGNOSIS — R002 Palpitations: Secondary | ICD-10-CM | POA: Diagnosis not present

## 2022-03-05 DIAGNOSIS — L400 Psoriasis vulgaris: Secondary | ICD-10-CM | POA: Diagnosis not present

## 2022-03-06 DIAGNOSIS — R002 Palpitations: Secondary | ICD-10-CM | POA: Diagnosis not present

## 2022-03-06 DIAGNOSIS — R0602 Shortness of breath: Secondary | ICD-10-CM | POA: Diagnosis not present

## 2022-03-13 DIAGNOSIS — E669 Obesity, unspecified: Secondary | ICD-10-CM | POA: Diagnosis not present

## 2022-03-13 DIAGNOSIS — I471 Supraventricular tachycardia: Secondary | ICD-10-CM | POA: Diagnosis not present

## 2022-03-13 DIAGNOSIS — T7840XA Allergy, unspecified, initial encounter: Secondary | ICD-10-CM | POA: Diagnosis not present

## 2022-03-13 DIAGNOSIS — Z720 Tobacco use: Secondary | ICD-10-CM | POA: Diagnosis not present

## 2022-03-25 ENCOUNTER — Ambulatory Visit: Payer: Medicaid Other | Admitting: Gastroenterology

## 2022-04-14 DIAGNOSIS — L403 Pustulosis palmaris et plantaris: Secondary | ICD-10-CM | POA: Diagnosis not present

## 2022-04-14 DIAGNOSIS — L02425 Furuncle of right lower limb: Secondary | ICD-10-CM | POA: Diagnosis not present

## 2022-04-14 DIAGNOSIS — R69 Illness, unspecified: Secondary | ICD-10-CM | POA: Diagnosis not present

## 2022-04-14 DIAGNOSIS — L02426 Furuncle of left lower limb: Secondary | ICD-10-CM | POA: Diagnosis not present

## 2022-04-15 ENCOUNTER — Telehealth: Payer: Self-pay

## 2022-04-15 NOTE — Telephone Encounter (Signed)
Provider Delaine Lame with Dermatology has some questions she wants to discuss with you about this patient. She would like you to call her on her cell phone at [phone]. Informed her you were in procedures this morning and then clinic this afternoon.

## 2022-04-16 ENCOUNTER — Encounter: Payer: Self-pay | Admitting: Gastroenterology

## 2022-04-16 NOTE — Telephone Encounter (Signed)
Called patient's dermatologist over her cell phone.  Patient has cutaneous lesions in her lower extremities and was started on minocycline.  Apparently, patient is having flareup of UC on Lialda.  She is scheduled to undergo colonoscopy on 6/5.    [name], MD [facility], [address]  [place], [place] [address]  Main: [phone]  Fax: [phone] Pager: [phone]

## 2022-04-17 ENCOUNTER — Other Ambulatory Visit: Payer: Self-pay | Admitting: Gastroenterology

## 2022-04-17 DIAGNOSIS — K5150 Left sided colitis without complications: Secondary | ICD-10-CM

## 2022-04-17 MED ORDER — PREDNISONE 10 MG PO TABS: ORAL_TABLET | ORAL | 0 refills | Status: AC

## 2022-04-28 ENCOUNTER — Other Ambulatory Visit: Payer: Self-pay

## 2022-04-28 ENCOUNTER — Ambulatory Visit: Payer: 59 | Admitting: Anesthesiology

## 2022-04-28 ENCOUNTER — Ambulatory Visit
Admission: RE | Admit: 2022-04-28 | Discharge: 2022-04-28 | Disposition: A | Discharge status: Home / Self Care | Payer: 59 | Attending: Gastroenterology | Admitting: Gastroenterology

## 2022-04-28 ENCOUNTER — Encounter: Payer: Self-pay | Admitting: Gastroenterology

## 2022-04-28 ENCOUNTER — Encounter
Admission: RE | Disposition: A | Discharge status: Home / Self Care | Payer: Self-pay | Source: Home / Self Care | Attending: Gastroenterology

## 2022-04-28 DIAGNOSIS — R011 Cardiac murmur, unspecified: Secondary | ICD-10-CM | POA: Diagnosis not present

## 2022-04-28 DIAGNOSIS — K6389 Other specified diseases of intestine: Secondary | ICD-10-CM | POA: Insufficient documentation

## 2022-04-28 DIAGNOSIS — R69 Illness, unspecified: Secondary | ICD-10-CM | POA: Diagnosis not present

## 2022-04-28 DIAGNOSIS — F17200 Nicotine dependence, unspecified, uncomplicated: Secondary | ICD-10-CM | POA: Diagnosis not present

## 2022-04-28 DIAGNOSIS — R634 Abnormal weight loss: Secondary | ICD-10-CM | POA: Diagnosis not present

## 2022-04-28 DIAGNOSIS — K5150 Left sided colitis without complications: Secondary | ICD-10-CM | POA: Diagnosis not present

## 2022-04-28 DIAGNOSIS — K5130 Ulcerative (chronic) rectosigmoiditis without complications: Secondary | ICD-10-CM | POA: Insufficient documentation

## 2022-04-28 DIAGNOSIS — Z6841 Body Mass Index (BMI) 40.0 and over, adult: Secondary | ICD-10-CM | POA: Insufficient documentation

## 2022-04-28 HISTORY — PX: COLONOSCOPY WITH PROPOFOL: SHX5780

## 2022-04-28 SURGERY — COLONOSCOPY WITH PROPOFOL
Anesthesia: General

## 2022-04-28 MED ORDER — SODIUM CHLORIDE 0.9 % IV SOLN: INTRAVENOUS | Status: DC

## 2022-04-28 MED ORDER — PROPOFOL 10 MG/ML IV BOLUS
INTRAVENOUS | Status: DC | PRN
  Administered 2022-04-28 (×2): 40 mg via INTRAVENOUS
  Administered 2022-04-28: 80 mg via INTRAVENOUS
  Administered 2022-04-28: 40 mg via INTRAVENOUS
  Administered 2022-04-28: 30 mg via INTRAVENOUS
  Administered 2022-04-28: 100 mg via INTRAVENOUS
  Administered 2022-04-28: 30 mg via INTRAVENOUS

## 2022-04-28 NOTE — Op Note (Signed)
[facility] Gastroenterology Patient Name: Victoria Chan Procedure Date: 04/28/2022 8:29 AM MRN: [id number] Account #: [id number] Date of Birth: 1966-11-04 Admit Type: Outpatient Age: 56 Room: [facility] ENDO ROOM 1 Gender: Female Note Status: Finalized Instrument Name: Colonoscope [id number] Procedure:             Colonoscopy Indications:           Last colonoscopy: April 2021, Disease activity                         assessment of left-sided chronic ulcerative colitis Providers:             Lin Landsman MD, MD Referring MD:          Youlanda Roys. Lovie Macadamia, MD (Referring MD) Medicines:             General Anesthesia Complications:         No immediate complications. Estimated blood loss: None. Procedure:             Pre-Anesthesia Assessment:                        - Prior to the procedure, a History and Physical was                         performed, and patient medications and allergies were                         reviewed. The patient is competent. The risks and                         benefits of the procedure and the sedation options and                         risks were discussed with the patient. All questions                         were answered and informed consent was obtained.                         Patient identification and proposed procedure were                         verified by the physician, the nurse, the                         anesthesiologist, the anesthetist and the technician                         in the pre-procedure area in the procedure room in the                         endoscopy suite. Mental Status Examination: alert and                         oriented. Airway Examination: normal oropharyngeal                         airway and neck mobility. Respiratory Examination:  clear to auscultation. CV Examination: normal.                         Prophylactic Antibiotics: The patient does not require                          prophylactic antibiotics. Prior Anticoagulants: The                         patient has taken no previous anticoagulant or                         antiplatelet agents. ASA Grade Assessment: II - A                         patient with mild systemic disease. After reviewing                         the risks and benefits, the patient was deemed in                         satisfactory condition to undergo the procedure. The                         anesthesia plan was to use general anesthesia.                         Immediately prior to administration of medications,                         the patient was re-assessed for adequacy to receive                         sedatives. The heart rate, respiratory rate, oxygen                         saturations, blood pressure, adequacy of pulmonary                         ventilation, and response to care were monitored                         throughout the procedure. The physical status of the                         patient was re-assessed after the procedure.                        After obtaining informed consent, the colonoscope was                         passed under direct vision. Throughout the procedure,                         the patient's blood pressure, pulse, and oxygen                         saturations were monitored continuously. The  Colonoscope was introduced through the anus and                         advanced to the the cecum, identified by appendiceal                         orifice and ileocecal valve. The colonoscopy was                         performed without difficulty. The patient tolerated                         the procedure well. The quality of the bowel                         preparation was evaluated using the BBPS [facility] Bowel                         Preparation Scale) with scores of: Right Colon = 3,                         Transverse Colon = 3 and Left Colon = 3 (entire mucosa                          seen well with no residual staining, small fragments                         of stool or opaque liquid). The total BBPS score                         equals 9. Findings:      The perianal and digital rectal examinations were normal. Pertinent       negatives include normal sphincter tone and no palpable rectal lesions.      The terminal ileum contained a few scattered non-bleeding aphthae. No       stigmata of recent bleeding were seen. Biopsies were taken with a cold       forceps for histology.      Inflammation was found in a continuous and circumferential pattern from       the anus to the sigmoid colon. This was graded as Mayo Score 2       (moderate, with marked erythema, absent vascular pattern, friability,       erosions), and when compared to the previous examination, the findings       are unchanged. Biopsies were taken with a cold forceps for histology.      Normal mucosa was found in the descending colon, in the transverse       colon, in the ascending colon and in the cecum. Biopsies were taken with       a cold forceps for histology. Impression:            - Aphtha in the terminal ileum. Biopsied.                        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 Biopsied.                        - Normal mucosa in the descending colon, in the                         transverse colon, in the ascending colon and in the                         cecum. Biopsied. Recommendation:        - Discharge patient to home (with escort).                        - Resume previous diet today.                        - Continue present medications.                        - Await pathology results.                        - Return to my office as previously scheduled. Procedure Code(s):     --- Professional ---                        [phone], Colonoscopy, flexible; with  biopsy, single or                         multiple Diagnosis Code(s):     --- Professional ---                        K63.89, Other specified diseases of intestine                        K51.30, Ulcerative (chronic) rectosigmoiditis without                         complications                        K51.50, Left sided colitis without complications CPT copyright 2019 American Medical Association. All rights reserved. The codes documented in this report are preliminary and upon coder review may  be revised to meet current compliance requirements. Dr. Ulyess Mort Lin Landsman MD, MD 04/28/2022 8:55:00 AM This report has been signed electronically. Number of Addenda: 0 Note Initiated On: 04/28/2022 8:29 AM Scope Withdrawal Time: 0 hours 13 minutes 9 seconds  Total Procedure Duration: 0 hours 15 minutes 53 seconds  Estimated Blood Loss:  Estimated blood loss: none.      [facility]

## 2022-04-28 NOTE — Anesthesia Postprocedure Evaluation (Signed)
Anesthesia Post Note  Patient: Victoria Chan  Procedure(s) Performed: COLONOSCOPY WITH PROPOFOL  Patient location during evaluation: PACU Anesthesia Type: General Level of consciousness: awake and alert Pain management: pain level controlled Vital Signs Assessment: post-procedure vital signs reviewed and stable Respiratory status: spontaneous breathing, nonlabored ventilation, respiratory function stable and patient connected to nasal cannula oxygen Cardiovascular status: blood pressure returned to baseline and stable Postop Assessment: no apparent nausea or vomiting Anesthetic complications: no   No notable events documented.   Last Vitals:  Vitals:   04/28/22 0854 04/28/22 0855  BP: 111/65 111/65  Pulse: 81 (!) 27  Resp:  15  Temp: (!) 36.3 C   SpO2: 100% (!) 83%    Last Pain:  Vitals:   04/28/22 0914  TempSrc:   PainSc: 0-No pain                 Molli Barrows

## 2022-04-28 NOTE — Anesthesia Preprocedure Evaluation (Signed)
Anesthesia Evaluation  Patient identified by MRN, date of birth, ID band Patient awake    Reviewed: Allergy & Precautions, H&P , NPO status , Patient's Chart, lab work & pertinent test results, reviewed documented beta blocker date and time   Airway Mallampati: II   Neck ROM: full    Dental  (+) Poor Dentition   Pulmonary neg pulmonary ROS, Current Smoker and Patient abstained from smoking.,    Pulmonary exam normal        Cardiovascular Exercise Tolerance: Good Normal cardiovascular exam+ Valvular Problems/Murmurs  Rhythm:regular Rate:Normal     Neuro/Psych  Neuromuscular disease negative psych ROS   GI/Hepatic negative GI ROS, Neg liver ROS,   Endo/Other  negative endocrine ROS  Renal/GU negative Renal ROS  negative genitourinary   Musculoskeletal   Abdominal   Peds  Hematology negative hematology ROS (+)   Anesthesia Other Findings Past Medical History: No date: Loss of weight No date: Murmur Past Surgical History: 03/20/2020: COLONOSCOPY WITH PROPOFOL; N/A     Comment:  Procedure: COLONOSCOPY WITH PROPOFOL;  Surgeon: Lucilla Lame, MD;  Location: [facility] ENDOSCOPY;  Service:               Endoscopy;  Laterality: N/A; No date: TUBAL LIGATION   Reproductive/Obstetrics negative OB ROS                             Anesthesia Physical Anesthesia Plan  ASA: 2  Anesthesia Plan: General   Post-op Pain Management:    Induction:   PONV Risk Score and Plan:   Airway Management Planned:   Additional Equipment:   Intra-op Plan:   Post-operative Plan:   Informed Consent: I have reviewed the patients History and Physical, chart, labs and discussed the procedure including the risks, benefits and alternatives for the proposed anesthesia with the patient or authorized representative who has indicated his/her understanding and acceptance.     Dental Advisory Given  Plan  Discussed with: CRNA  Anesthesia Plan Comments:         Anesthesia Quick Evaluation

## 2022-04-28 NOTE — H&P (Signed)
[name], MD [address]  [place]  [place], [place] [address]  Main: [phone]  Fax: [phone] Pager: [phone]  Primary Care Physician:  Juluis Pitch, MD Primary Gastroenterologist:  Dr. [name]  Pre-Procedure History & Physical: HPI:  Victoria Chan is a 56 y.o. female is here for an colonoscopy.   Past Medical History:  Diagnosis Date   Loss of weight    Murmur     Past Surgical History:  Procedure Laterality Date   COLONOSCOPY WITH PROPOFOL N/A 03/20/2020   Procedure: COLONOSCOPY WITH PROPOFOL;  Surgeon: Lucilla Lame, MD;  Location: [facility] ENDOSCOPY;  Service: Endoscopy;  Laterality: N/A;   TUBAL LIGATION      Prior to Admission medications   Medication Sig Start Date End Date Taking? Authorizing Provider  furosemide (LASIX) 20 MG tablet Take 1 tablet (20 mg total) by mouth daily. 02/18/22  Yes Yaretzy Olazabal, Tally Due, MD  minocycline (MINOCIN) 100 MG capsule Take 100 mg by mouth 2 (two) times daily. 04/15/22  Yes [provider]  predniSONE (DELTASONE) 10 MG tablet Take 4 tablets (40 mg total) by mouth daily with breakfast for 7 days, THEN 3 tablets (30 mg total) daily with breakfast for 7 days, THEN 2 tablets (20 mg total) daily with breakfast for 7 days, THEN 1 tablet (10 mg total) daily with breakfast for 7 days. 04/17/22 05/15/22 Yes Parisa Pinela, Tally Due, MD  Adalimumab (HUMIRA PEN) 40 MG/0.4ML PNKT Inject 40 mg into the skin every 14 (fourteen) days. Patient not taking: Reported on 02/18/2022 12/25/21   Lin Landsman, MD  fluocinonide ointment (LIDEX) 0.05 % SMARTSIG:sparingly Topical Twice Daily PRN 02/12/22   [provider]  mesalamine (LIALDA) 1.2 g EC tablet Take 2 tablets (2.4 g total) by mouth 2 (two) times daily. 02/18/22 03/20/22  Lin Landsman, MD  mupirocin ointment (BACTROBAN) 2 % Apply 1 application. topically 3 (three) times daily. 04/15/22   [provider]  Na Sulfate-K Sulfate-Mg Sulf 17.5-3.13-1.6  GM/177ML SOLN SMARTSIG:354 Milliliter(s) By Mouth Once 02/18/22   [provider]  Na Sulfate-K Sulfate-Mg Sulf 17.5-3.13-1.6 GM/177ML SOLN TAKE 354 ML BY MOUTH AS ONE DOSE 02/18/22   [provider]  dicyclomine (BENTYL) 10 MG capsule Take 1 capsule (10 mg total) by mouth 3 (three) times daily as needed for spasms (abdominal discomfort). 05/30/16 05/31/16  Harvest Dark, MD    Allergies as of 02/18/2022   (No Known Allergies)    Family History  Problem Relation Age of Onset   Heart disease Father     Social History   Socioeconomic History   Marital status: Married    Spouse name: Not on file   Number of children: Not on file   Years of education: Not on file   Highest education level: Not on file  Occupational History   Not on file  Tobacco Use   Smoking status: Every Day   Smokeless tobacco: Never  Vaping Use   Vaping Use: Never used  Substance and Sexual Activity   Alcohol use: Not Currently   Drug use: No   Sexual activity: Not on file  Other Topics Concern   Not on file  Social History Narrative   Not on file   Social Determinants of Health   Financial Resource Strain: Not on file  Food Insecurity: Not on file  Transportation Needs: Not on file  Physical Activity: Not on file  Stress: Not on file  Social Connections: Not on file  Intimate Partner Violence: Not on file    Review of Systems: See HPI, otherwise negative ROS  Physical Exam: BP (!) 170/87   Pulse 85   Temp (!) 97.4 F (36.3 C) (Temporal)   Resp 18   Ht 5' 3"  (1.6 m)   Wt 102.5 kg   LMP 04/24/2016 Comment: neg preg test  SpO2 100%   BMI 40.03 kg/m  General:   Alert,  pleasant and cooperative in NAD Head:  Normocephalic and atraumatic. Neck:  Supple; no masses or thyromegaly. Lungs:  Clear throughout to auscultation.    Heart:  Regular rate and rhythm. Abdomen:  Soft, nontender and nondistended. Normal bowel sounds, without guarding, and without rebound.    Neurologic:  Alert and  oriented x4;  grossly normal neurologically.  Impression/Plan: Victoria Chan is here for an colonoscopy to be performed for history of left sided colitis  Risks, benefits, limitations, and alternatives regarding  colonoscopy have been reviewed with the patient.  Questions have been answered.  All parties agreeable.   Sherri Sear, MD  04/28/2022, 8:12 AM

## 2022-04-28 NOTE — Transfer of Care (Signed)
Immediate Anesthesia Transfer of Care Note  Patient: Anacaren Kohan Schappert  Procedure(s) Performed: COLONOSCOPY WITH PROPOFOL  Patient Location: Endoscopy Unit  Anesthesia Type:General  Level of Consciousness: awake, alert  and oriented  Airway & Oxygen Therapy: Patient Spontanous Breathing and Patient connected to nasal cannula oxygen  Post-op Assessment: Report given to RN, Post -op Vital signs reviewed and stable and Patient moving all extremities  Post vital signs: Reviewed and stable  Last Vitals:  Vitals Value Taken Time  BP 111/65 04/28/22 0855  Temp    Pulse 27 04/28/22 0855  Resp 15 04/28/22 0855  SpO2 83 % 04/28/22 0855    Last Pain:  Vitals:   04/28/22 0810  TempSrc: Temporal  PainSc: 0-No pain         Complications: No notable events documented.

## 2022-04-29 ENCOUNTER — Encounter: Payer: Self-pay | Admitting: Gastroenterology

## 2022-04-30 ENCOUNTER — Telehealth: Payer: Self-pay

## 2022-04-30 LAB — SURGICAL PATHOLOGY

## 2022-04-30 NOTE — Telephone Encounter (Signed)
Called and patient verbalized understanding of results. She states that she does not want to start medication till she talks to you

## 2022-04-30 NOTE — Telephone Encounter (Signed)
-----   Message from Lin Landsman, MD sent at 04/30/2022  4:31 PM EDT ----- Victoria Chan  Please inform patient that the biopsy results from recent colonoscopy shows mild to moderate chronic active colitis in her left colon.  As discussed earlier with patient, I recommend to start Rinvoq which is an oral agent for the treatment of ulcerative colitis.  This is another biologic in place of Humira  Rohini Vanga

## 2022-05-07 DIAGNOSIS — L400 Psoriasis vulgaris: Secondary | ICD-10-CM | POA: Diagnosis not present

## 2022-05-12 ENCOUNTER — Other Ambulatory Visit: Payer: Self-pay | Admitting: Gastroenterology

## 2022-05-12 DIAGNOSIS — K5150 Left sided colitis without complications: Secondary | ICD-10-CM

## 2022-05-21 ENCOUNTER — Ambulatory Visit: Payer: 59 | Admitting: Gastroenterology

## 2022-05-21 ENCOUNTER — Encounter: Payer: Self-pay | Admitting: Gastroenterology

## 2022-05-21 VITALS — BP 156/92 | HR 94 | Temp 98.3°F | Ht 63.5 in | Wt 222.0 lb

## 2022-05-21 DIAGNOSIS — K5150 Left sided colitis without complications: Secondary | ICD-10-CM | POA: Diagnosis not present

## 2022-05-21 DIAGNOSIS — L403 Pustulosis palmaris et plantaris: Secondary | ICD-10-CM | POA: Insufficient documentation

## 2022-05-21 NOTE — Progress Notes (Signed)
Victoria Darby, MD [address]  [place]  [place], [place] [address]  Main: [phone]  Fax: [phone]    Gastroenterology Consultation  Referring Provider:     Ranae Chan, [place] Primary Care Physician:  Victoria Pitch, MD Primary Gastroenterologist:  Dr. Lucilla Chan Reason for Consultation:     Left-sided ulcerative colitis        HPI:   Victoria Chan is a 56 y.o. female referred by Dr. Juluis Pitch, MD  for consultation & management of left-sided colitis.  Patient reports approximately 5 years history of relapsing and remitting symptoms of left lower abdominal pain associated with pinkish mucus mixed with soft mushy stools.  She has lost about 20 pounds within last few years and her weight continues to downtrend.  She reports good appetite, patient is recently evaluated by Dr. 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  Background chronicity was not mentioned.  Patient is therefore referred to me for further management Her most recent labs from 08/2019 revealed normal hemoglobin, normal CMP She smokes cigarettes 1 pack/day  Follow-up visit 05/08/2020 Patient responded very well to prednisone course.  She is currently on 10 mg once a day for past 5 days.  She reports her abdominal pain and diarrhea, rectal bleeding have resolved.  She states that she does not recollect when it was last time that she had pain.  She did not tolerate prednisone well, she felt irritable, nervousness, lack of sleep, increased appetite.  She has significantly elevated fecal calprotectin levels 790  Follow-up visit 10/02/2020 Patient reports doing well off prednisone and currently on Lialda 1.2 g 4 tablets 2 times daily.  The prescription for Lialda was mistakenly sent for 8 tablets a day.  I have discussed with patient and clarified that she is supposed to be taking only 4 tablets  daily and she understands it.  She is currently receiving Lialda prescription from [facility]. She reports that she had mild left lower quadrant discomfort few months ago for which she took prednisone for about 3 to 5 days.  She reports having 1-2 formed bowel movements daily, denies abdominal pain, blood and mucus in stool.  Her weight is stable.  She continues to smoke cigarettes, currently 1 pack/day.  She tried for [facility] health charity care, did not qualify for it.  Patient does not have any other concerns today  Follow-up visit 04/09/2021 Patient is here for follow-up of her ulcerative colitis.  She reports ongoing abdominal pain, predominantly in the left side.  She currently has insurance and she thinks Lialda has not been helping much.  She does report loose stools without any blood.  She is compliant with Lialda 2.4 g twice daily she is gaining weight.  She continues to smoke cigarettes.  Follow-up visit 05/14/2021 Patient's Humira has been approved.  She received message from her pharmacy that medication is ready.  She has to call the pharmacy to confirm the delivery of medication.  She continues to take prednisone, decreased to 20 mg last week.  She continues to feel better from GI standpoint.  However, she does have mood swings on prednisone as well as gaining weight.  She has disturbed sleep as well.  Follow-up visit 08/14/2021 Patient is here for follow-up of ulcerative colitis.  Patient reports that her GI symptoms have resolved on Humira.  She started Humira in early July 2022.  She has noticed  symptoms of feeling dizzy, dull headache, leg pain.  She also noticed intermittent palpitations at bedtime, about once a week.  She is not sure if the symptoms have occurred closer to the Humira injection but the symptoms have drawn her attention wanted to know if these are secondary to Humira.  She continues to smoke tobacco.  Patient is gaining weight and she acknowledges that her meal  portion has increased in size.  TSH in 7/22 normal.  She also reports pain in her legs at rest.  Follow-up visit 01/20/2022 Patient is here for follow-up of ulcerative colitis.  She is doing very well on Humira every other week, and has been tolerating well, denies any symptoms associated with it.  She denies any abdominal pain, diarrhea, rectal bleeding.  She is concerned about worsening of ventral hernia associated with discomfort, particularly after eating.  She states that she has ventral hernia for several years, lately it has been getting worse.  She has history of tobacco use, associated with chronic cough.  She has gained weight within last 6 months, trying to lose weight.  She has cut down on smoking to 15 cigarettes/day.  Patient has not received any vaccinations yet. Patient has seen [facility] health cardiology, Victoria Chan received 2 week cardiac monitor as well as echocardiogram.  She found that the [facility] health cardiology is out of network, did not follow-up with them again.  Patient did not undergo echocardiography yet.  Video visit 02/18/2022 Victoria Chan is a 56 year old pleasant female pleasant female with history of moderate to severe left-sided colitis, maintained on Humira biweekly monotherapy.  Patient developed pustular rash on her palms and soles about 2 weeks ago when she reached out to me, initially she thought it was hand-foot and mouth disease.  I have advised her to hold Humira injection.  She saw Dr. Maudie Chan at [facility] dermatologist who performed skin biopsy and diagnosed her with palmoplantar psoriasis.  She is started on topical steroid cream.  Her primary care provider also started her on short course of prednisone.  Patient has not restarted Humira since then.  She feels that the rash is slowly healing but it has significantly impacted her daily life.  She is doing well from colitis standpoint.  She was also evaluated by general surgeon, Dr. Christian Chan for possible ventral hernia and she was told that  she does not have ventral hernia, she has diastases recti.  Follow-up visit 05/21/2022 Patient is here for follow-up of ulcerative colitis.  Patient has finished prednisone course about a week ago and started noticing flareup of her psoriasis.  Her GI symptoms have been fairly controlled.  She did respond well to prednisone.  She underwent colonoscopy which revealed mild to moderate left-sided ulcerative colitis.  I recommended patient to start Rinvoq and is concerned about cardiovascular side effects given her smoking history.  She would like to discuss alternative treatment options for her UC.  Patient underwent cardiac work-up and it has been unremarkable.  She continues to smoke cigarettes.  Patient also reports bilateral hip pain since stopping prednisone  NSAIDs: Unknown  Antiplts/Anticoagulants/Anti thrombotics: None Patient denies family history of IBD, GI malignancy  GI Procedures: Colonoscopy 05/13/2022 - Aphtha in the terminal ileum. Biopsied. - Moderately active (Mayo Score 2) proctosigmoid ulcerative colitis, unchanged since the last examination. Biopsied. - Normal mucosa in the descending colon, in the transverse colon, in the ascending colon and in the cecum. Biopsied. DIAGNOSIS:  A.  TERMINAL ILEUM; COLD BIOPSY:  - ILEAL MUCOSA WITH  PRESERVED VILLOUS ARCHITECTURE AND PEYER'S PATCHES.  - NEGATIVE FOR ACTIVE INFLAMMATION.   B.  RIGHT COLON; COLD BIOPSY:  - COLONIC MUCOSA WITH INTACT CRYPT ARCHITECTURE.  - NEGATIVE FOR ACTIVE INFLAMMATION.   C.  DESCENDING COLON; COLD BIOPSY:  - MILD CHRONIC ACTIVE COLITIS.   D.  SIGMOID COLON; COLD BIOPSY:  - MODERATE CHRONIC ACTIVE COLITIS.   E. RECTUM; COLD BIOPSY:  - MILD CHRONIC ACTIVE PROCTITIS.   Colonoscopy 03/20/2020 - The examined portion of the ileum was normal. Biopsied. - Diffuse severe inflammation was found at 40 cm proximal to the anus secondary to proctosigmoid colitis. Biopsied. - Left and right sised biopsies were  obtained.  DIAGNOSIS:  A. TERMINAL ILEUM; COLD BIOPSY:  - ENTERIC MUCOSA WITH NO SIGNIFICANT PATHOLOGIC ALTERATION.  - NEGATIVE FOR INFLAMMATION, DYSPLASIA, AND MALIGNANCY.   B. COLON, RIGHT; COLD BIOPSY:  - COLONIC MUCOSA WITH NO SIGNIFICANT PATHOLOGIC ALTERATION.  - NEGATIVE FOR MICROSCOPIC COLITIS, DYSPLASIA, AND MALIGNANCY.   C. COLON, RECTOSIGMOID; COLD BIOPSY:  - MODERATE ACTIVE PROCTOCOLITIS WITH ULCER EXUDATE.  - SEE COMMENT.   Comment:  Diagnostic considerations include acute self-limited / infectious  colitis, ischemia, medication effects, and inflammatory bowel disease.  Clinical correlation is recommended.  There is no evidence of dysplasia  or malignancy.   Past Medical History:  Diagnosis Date   Loss of weight    Murmur     Past Surgical History:  Procedure Laterality Date   COLONOSCOPY WITH PROPOFOL N/A 03/20/2020   Procedure: COLONOSCOPY WITH PROPOFOL;  Surgeon: Victoria Lame, MD;  Location: [facility] ENDOSCOPY;  Service: Endoscopy;  Laterality: N/A;   COLONOSCOPY WITH PROPOFOL N/A 04/28/2022   Procedure: COLONOSCOPY WITH PROPOFOL;  Surgeon: Lin Landsman, MD;  Location: [facility] ENDOSCOPY;  Service: Gastroenterology;  Laterality: N/A;   TUBAL LIGATION      Current Outpatient Medications:    fluocinonide ointment (LIDEX) 0.05 %, SMARTSIG:sparingly Topical Twice Daily PRN, Disp: , Rfl:    furosemide (LASIX) 20 MG tablet, Take 1 tablet (20 mg total) by mouth daily., Disp: 7 tablet, Rfl: 1   minocycline (MINOCIN) 100 MG capsule, Take 100 mg by mouth 2 (two) times daily., Disp: , Rfl:    mupirocin ointment (BACTROBAN) 2 %, Apply 1 application. topically 3 (three) times daily., Disp: , Rfl:    mesalamine (LIALDA) 1.2 g EC tablet, Take 2 tablets (2.4 g total) by mouth 2 (two) times daily. (Patient not taking: Reported on 05/21/2022), Disp: 120 tablet, Rfl: 3    Family History  Problem Relation Age of Onset   Heart disease Father      Social History   Tobacco Use    Smoking status: Every Day   Smokeless tobacco: Never  Vaping Use   Vaping Use: Never used  Substance Use Topics   Alcohol use: Not Currently   Drug use: No    Allergies as of 05/21/2022   (No Known Allergies)    Review of Systems:    All systems reviewed and negative except where noted in HPI.   Physical Exam:  BP (!) 156/92 (BP Location: Left Arm, Patient Position: Sitting, Cuff Size: Normal)   Pulse 94   Temp 98.3 F (36.8 C) (Oral)   Ht 5' 3.5" (1.613 m)   Wt 222 lb (100.7 kg)   LMP 04/24/2016 Comment: neg preg test  BMI 38.71 kg/m  Patient's last menstrual period was 04/24/2016.  General:   Alert,  Well-developed, well-nourished, pleasant and cooperative in NAD Head:  Normocephalic and atraumatic. Eyes:  Sclera clear, no icterus.   Conjunctiva pink. Ears:  Normal auditory acuity. Nose:  No deformity, discharge, or lesions. Mouth:  No deformity or lesions,oropharynx pink & moist. Neck:  Supple; no masses or thyromegaly. Lungs:  Respirations even and unlabored.  Clear throughout to auscultation.   No wheezes, crackles, or rhonchi. No acute distress. Heart:  Regular rate and rhythm; no murmurs, clicks, rubs, or gallops. Abdomen:  Normal bowel sounds. Soft, obese, nontender, reducible ventral hernia without masses, hepatosplenomegaly or hernias noted.  No guarding or rebound tenderness.   Rectal: Not performed Msk:  Symmetrical without gross deformities. Good, equal movement & strength bilaterally. Pulses:  Normal pulses noted. Extremities:  No clubbing or edema.  No cyanosis. Neurologic:  Alert and oriented x3;  grossly normal neurologically. Skin: Psoriatic rash on bilateral palms and soles. No jaundice. Psych:  Alert and cooperative. Normal mood and affect.  Imaging Studies: Reviewed  Assessment and Plan:   Victoria Chan is a 56 y.o. Caucasian female with chronic tobacco use is seen for follow-up of moderate to severe left-sided ulcerative colitis. 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  Discontinued Humira. I have discussed with patient regarding alternative long-term maintenance therapy such as ozanimod or vedolizumab.  Patient is reluctant and scared to start any new biologic therapy given palmoplantar psoriasis although I tried to reassure patient that palmoplantar psoriasis is reported only with anti-TNF agents.  She wanted to try Lialda, unfortunately resulted in exacerbation of colitis and responded well to prednisone.  Repeat colonoscopy in 04/2022 confirmed mild to moderate left-sided UC   Left-sided ulcerative colitis: Currently in partial clinical remission secondary to recent prednisone use Currently not on any maintenance therapy Today, have discussed with patient regarding alternative biologic agents such as ozanimod or upadacitinib or vedolizumab.  Patient preferred to try ozanimod/Zeposia, will apply to her insurance Patient's cardiac work-up has been completed and unremarkable Check CBC, LFTs today and every 3 months  Palmoplantar psoriasis Follow-up with Dr. Maudie Chan, dermatologist  Quantiferon Gold: negative on 04/09/2021 off prednisone.   Acute viral hepatitis panel negative Discussed about abstinence from smoking I have also discussed regarding hepatitis vaccine, pneumonia vaccine and Shingrix vaccine.  Patient is hesitant to receive pneumonia vaccine and hepatitis vaccine.  She said she will think about it and discuss with PCP.  Discussed about health maintenance, she has annual physical scheduled with her PCP for mammogram, Pap smear  Follow up in 3 months or contact via MyChart as needed   Victoria Darby, MD

## 2022-05-22 LAB — CBC
Hematocrit: 39.6 % (ref 34.0–46.6)
Hemoglobin: 13.6 g/dL (ref 11.1–15.9)
MCH: 30.2 pg (ref 26.6–33.0)
MCHC: 34.3 g/dL (ref 31.5–35.7)
MCV: 88 fL (ref 79–97)
Platelets: 312 10*3/uL (ref 150–450)
RBC: 4.51 x10E6/uL (ref 3.77–5.28)
RDW: 12.2 % (ref 11.7–15.4)
WBC: 10 10*3/uL (ref 3.4–10.8)

## 2022-05-22 LAB — HEPATIC FUNCTION PANEL
ALT: 12 IU/L (ref 0–32)
AST: 9 IU/L (ref 0–40)
Albumin: 4 g/dL (ref 3.8–4.9)
Alkaline Phosphatase: 67 IU/L (ref 44–121)
Bilirubin Total: <0.2 mg/dL (ref 0.0–1.2)
Bilirubin, Direct: <0.1 mg/dL (ref 0.00–0.40)
Total Protein: 7.1 g/dL (ref 6.0–8.5)

## 2022-07-02 ENCOUNTER — Telehealth: Payer: Self-pay

## 2022-07-02 NOTE — Telephone Encounter (Signed)
Medication is approved from 07/02/2022 to 07/03/2023

## 2022-07-02 NOTE — Telephone Encounter (Signed)
Did PA on cover my meds for Zeposia 0.92. waiting on response from insurance. [facility] called to ask more information for the PA and I answered there questions

## 2022-08-25 ENCOUNTER — Ambulatory Visit: Payer: 59 | Admitting: Gastroenterology

## 2022-10-24 DIAGNOSIS — Z419 Encounter for procedure for purposes other than remedying health state, unspecified: Secondary | ICD-10-CM | POA: Diagnosis not present

## 2022-11-24 DIAGNOSIS — Z419 Encounter for procedure for purposes other than remedying health state, unspecified: Secondary | ICD-10-CM | POA: Diagnosis not present

## 2022-12-25 DIAGNOSIS — Z419 Encounter for procedure for purposes other than remedying health state, unspecified: Secondary | ICD-10-CM | POA: Diagnosis not present

## 2023-07-03 ENCOUNTER — Telehealth: Payer: Self-pay

## 2023-07-03 NOTE — Telephone Encounter (Signed)
Submitted PA through Cover my meds for Zeposia. Waiting on response from insurance company. Patient is due for a appointment. Sent mychart message to patient

## 2023-07-06 ENCOUNTER — Telehealth: Payer: Self-pay

## 2023-07-06 NOTE — Telephone Encounter (Signed)
Noted, agree with above  RV

## 2023-07-06 NOTE — Telephone Encounter (Signed)
[facility] sent [place] a fax to filled out. Filled out fax and filled out and faxed back to them

## 2023-07-06 NOTE — Telephone Encounter (Signed)
Called and left a message to make a appointment with Dr. Allegra Lai on [place]. Called home it rang and then disconnected

## 2023-07-06 NOTE — Telephone Encounter (Signed)
Insurance company denied the [place] because she has not been seen in over a year and no documentation since she has been on the medication if it has been improving her symptoms.   Called again and left a message for call back to make appointment. Called this morning also.  Sent mychart message about appointment on 07/03/2023 08/25/22.  She no showed appointment on 08/25/2022

## 2023-07-06 NOTE — Telephone Encounter (Signed)
Received call from [facility] regarding some additional questions regarding the Zeposia. They are requesting any telephone notes or updated labs etc to be faxed to Attn: Prior Authorization Dept [id number]  Please put the prior authorization # [id number] on fax cover sheet.

## 2023-07-06 NOTE — Telephone Encounter (Signed)
I do not have anything else to send them because patient has not been seen sent that to a fax to them. Sent patient a message informing them that she needed a appointment

## 2023-07-20 ENCOUNTER — Telehealth: Payer: Self-pay

## 2023-07-20 NOTE — Telephone Encounter (Signed)
Had a message from last week from the Zeposia 365 support team wanting to know if we were going to do a appeal for patient Zeposia. Returned there call at [phone] and informed them no because they have not been seen in over a year. They documented this and asked if we wanted patient enrolled in the bridge program since insurance is requiring a appeal informed them no because they needed a appointment
